# Patient Record
Sex: Male | Born: 1985 | Race: White | Hispanic: No | Marital: Married | Smoking: Never smoker
Health system: Southern US, Community
[De-identification: ages and names within clinical notes are randomized; demographics above are authoritative.]

## PROBLEM LIST (undated history)

## (undated) DIAGNOSIS — R202 Paresthesia of skin: Secondary | ICD-10-CM

## (undated) DIAGNOSIS — R001 Bradycardia, unspecified: Secondary | ICD-10-CM

## (undated) HISTORY — PX: BACK SURGERY: SHX140

## (undated) HISTORY — DX: Paresthesia of skin: R20.2

## (undated) HISTORY — PX: COLOPROCTECTOMY W/ ILEO J POUCH: SUR277

---
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
## 2004-10-05 ENCOUNTER — Ambulatory Visit: Payer: Self-pay | Admitting: Gastroenterology

## 2006-05-24 ENCOUNTER — Ambulatory Visit: Payer: Self-pay | Admitting: Gastroenterology

## 2008-01-02 ENCOUNTER — Other Ambulatory Visit: Admission: RE | Admit: 2008-01-02 | Discharge: 2008-01-02 | Payer: Self-pay | Admitting: Radiology

## 2008-11-17 ENCOUNTER — Ambulatory Visit: Payer: Self-pay | Admitting: Gastroenterology

## 2008-11-18 LAB — CONVERTED CEMR LAB
ALT: 15 units/L (ref 0–53)
AST: 18 units/L (ref 0–37)
Albumin: 4 g/dL (ref 3.5–5.2)
Alkaline Phosphatase: 89 units/L (ref 39–117)
BUN: 12 mg/dL (ref 6–23)
Basophils Absolute: 0 10*3/uL (ref 0.0–0.1)
Basophils Relative: 0.1 % (ref 0.0–3.0)
CO2: 33 meq/L — ABNORMAL HIGH (ref 19–32)
Calcium: 9.5 mg/dL (ref 8.4–10.5)
Chloride: 102 meq/L (ref 96–112)
Creatinine, Ser: 1.1 mg/dL (ref 0.4–1.5)
Eosinophils Absolute: 0.3 10*3/uL (ref 0.0–0.7)
Eosinophils Relative: 4 % (ref 0.0–5.0)
GFR calc Af Amer: 107 mL/min
GFR calc non Af Amer: 88 mL/min
Glucose, Bld: 79 mg/dL (ref 70–99)
HCT: 43.6 % (ref 39.0–52.0)
Hemoglobin: 15.1 g/dL (ref 13.0–17.0)
Lymphocytes Relative: 25.3 % (ref 12.0–46.0)
MCHC: 34.7 g/dL (ref 30.0–36.0)
MCV: 88.6 fL (ref 78.0–100.0)
Monocytes Absolute: 0.6 10*3/uL (ref 0.1–1.0)
Monocytes Relative: 7.9 % (ref 3.0–12.0)
Neutro Abs: 5.2 10*3/uL (ref 1.4–7.7)
Neutrophils Relative %: 62.7 % (ref 43.0–77.0)
Platelets: 361 10*3/uL (ref 150–400)
Potassium: 4 meq/L (ref 3.5–5.1)
RBC: 4.92 M/uL (ref 4.22–5.81)
RDW: 11.6 % (ref 11.5–14.6)
Sed Rate: 12 mm/hr (ref 0–16)
Sodium: 141 meq/L (ref 135–145)
Total Bilirubin: 0.7 mg/dL (ref 0.3–1.2)
Total Protein: 7.2 g/dL (ref 6.0–8.3)
WBC: 8.2 10*3/uL (ref 4.5–10.5)

## 2008-11-20 ENCOUNTER — Encounter: Payer: Self-pay | Admitting: Gastroenterology

## 2008-11-20 ENCOUNTER — Ambulatory Visit: Payer: Self-pay | Admitting: Gastroenterology

## 2008-11-24 ENCOUNTER — Encounter: Payer: Self-pay | Admitting: Gastroenterology

## 2008-12-15 ENCOUNTER — Ambulatory Visit: Payer: Self-pay | Admitting: Gastroenterology

## 2009-08-10 ENCOUNTER — Ambulatory Visit: Payer: Self-pay | Admitting: Internal Medicine

## 2009-08-10 ENCOUNTER — Telehealth: Payer: Self-pay | Admitting: Gastroenterology

## 2009-08-10 DIAGNOSIS — K625 Hemorrhage of anus and rectum: Secondary | ICD-10-CM | POA: Insufficient documentation

## 2009-08-11 ENCOUNTER — Encounter: Payer: Self-pay | Admitting: Physician Assistant

## 2009-08-11 LAB — CONVERTED CEMR LAB
BUN: 13 mg/dL (ref 6–23)
Basophils Absolute: 0.1 10*3/uL (ref 0.0–0.1)
Basophils Relative: 0.9 % (ref 0.0–3.0)
CO2: 33 meq/L — ABNORMAL HIGH (ref 19–32)
Calcium: 8.9 mg/dL (ref 8.4–10.5)
Chloride: 104 meq/L (ref 96–112)
Creatinine, Ser: 1.3 mg/dL (ref 0.4–1.5)
Eosinophils Absolute: 0.3 10*3/uL (ref 0.0–0.7)
Eosinophils Relative: 5.4 % — ABNORMAL HIGH (ref 0.0–5.0)
GFR calc non Af Amer: 72.07 mL/min (ref >60)
Glucose, Bld: 80 mg/dL (ref 70–99)
HCT: 41 % (ref 39.0–52.0)
Hemoglobin: 13.6 g/dL (ref 13.0–17.0)
Lymphocytes Relative: 24.1 % (ref 12.0–46.0)
Lymphs Abs: 1.4 10*3/uL (ref 0.7–4.0)
MCHC: 33.2 g/dL (ref 30.0–36.0)
MCV: 85.5 fL (ref 78.0–100.0)
Monocytes Absolute: 0.8 10*3/uL (ref 0.1–1.0)
Monocytes Relative: 13.4 % — ABNORMAL HIGH (ref 3.0–12.0)
Neutro Abs: 3.4 10*3/uL (ref 1.4–7.7)
Neutrophils Relative %: 56.2 % (ref 43.0–77.0)
Platelets: 361 10*3/uL (ref 150.0–400.0)
Potassium: 4 meq/L (ref 3.5–5.1)
RBC: 4.79 M/uL (ref 4.22–5.81)
RDW: 12 % (ref 11.5–14.6)
Sodium: 139 meq/L (ref 135–145)
WBC: 6 10*3/uL (ref 4.5–10.5)

## 2009-08-19 ENCOUNTER — Telehealth (INDEPENDENT_AMBULATORY_CARE_PROVIDER_SITE_OTHER): Payer: Self-pay | Admitting: *Deleted

## 2009-08-26 ENCOUNTER — Ambulatory Visit: Payer: Self-pay | Admitting: Gastroenterology

## 2009-10-13 ENCOUNTER — Encounter: Payer: Self-pay | Admitting: Gastroenterology

## 2009-10-13 ENCOUNTER — Telehealth: Payer: Self-pay | Admitting: Gastroenterology

## 2009-11-02 ENCOUNTER — Ambulatory Visit: Payer: Self-pay | Admitting: Gastroenterology

## 2009-11-02 DIAGNOSIS — K219 Gastro-esophageal reflux disease without esophagitis: Secondary | ICD-10-CM | POA: Insufficient documentation

## 2010-01-07 ENCOUNTER — Ambulatory Visit: Payer: Self-pay | Admitting: Gastroenterology

## 2010-01-07 LAB — CONVERTED CEMR LAB
Albumin: 4.2 g/dL (ref 3.5–5.2)
Alkaline Phosphatase: 69 units/L (ref 39–117)
Basophils Absolute: 0.1 10*3/uL (ref 0.0–0.1)
Eosinophils Absolute: 0.4 10*3/uL (ref 0.0–0.7)
Hemoglobin: 14.6 g/dL (ref 13.0–17.0)
Lymphocytes Relative: 22.6 % (ref 12.0–46.0)
MCHC: 32.7 g/dL (ref 30.0–36.0)
MCV: 85.3 fL (ref 78.0–100.0)
Monocytes Absolute: 0.7 10*3/uL (ref 0.1–1.0)
Neutro Abs: 4.2 10*3/uL (ref 1.4–7.7)
RDW: 14.2 % (ref 11.5–14.6)

## 2010-01-19 ENCOUNTER — Telehealth: Payer: Self-pay | Admitting: Gastroenterology

## 2010-01-20 ENCOUNTER — Ambulatory Visit: Payer: Self-pay | Admitting: Gastroenterology

## 2010-01-20 LAB — CONVERTED CEMR LAB
ALT: 12 units/L (ref 0–53)
AST: 13 units/L (ref 0–37)
Albumin: 3.8 g/dL (ref 3.5–5.2)
Alkaline Phosphatase: 56 units/L (ref 39–117)
Basophils Relative: 0.5 % (ref 0.0–3.0)
Eosinophils Relative: 4.9 % (ref 0.0–5.0)
HCT: 41.6 % (ref 39.0–52.0)
Hemoglobin: 13.9 g/dL (ref 13.0–17.0)
Lymphs Abs: 2.4 10*3/uL (ref 0.7–4.0)
Monocytes Relative: 10.2 % (ref 3.0–12.0)
Neutro Abs: 5 10*3/uL (ref 1.4–7.7)
RBC: 4.84 M/uL (ref 4.22–5.81)
RDW: 13 % (ref 11.5–14.6)
Total Protein: 6.4 g/dL (ref 6.0–8.3)
WBC: 8.7 10*3/uL (ref 4.5–10.5)

## 2010-01-25 ENCOUNTER — Telehealth: Payer: Self-pay | Admitting: Gastroenterology

## 2010-01-27 ENCOUNTER — Ambulatory Visit: Payer: Self-pay | Admitting: Gastroenterology

## 2010-01-27 ENCOUNTER — Inpatient Hospital Stay (HOSPITAL_COMMUNITY): Admission: AD | Admit: 2010-01-27 | Discharge: 2010-02-08 | Payer: Self-pay | Admitting: Gastroenterology

## 2010-02-02 ENCOUNTER — Encounter: Payer: Self-pay | Admitting: Gastroenterology

## 2010-02-08 ENCOUNTER — Telehealth: Payer: Self-pay | Admitting: Gastroenterology

## 2010-02-08 ENCOUNTER — Encounter (INDEPENDENT_AMBULATORY_CARE_PROVIDER_SITE_OTHER): Payer: Self-pay | Admitting: *Deleted

## 2010-02-24 ENCOUNTER — Ambulatory Visit: Payer: Self-pay | Admitting: Gastroenterology

## 2010-03-02 ENCOUNTER — Encounter: Payer: Self-pay | Admitting: Gastroenterology

## 2010-03-03 ENCOUNTER — Encounter: Payer: Self-pay | Admitting: Gastroenterology

## 2010-03-03 ENCOUNTER — Encounter (HOSPITAL_COMMUNITY): Admission: RE | Admit: 2010-03-03 | Discharge: 2010-06-01 | Payer: Self-pay | Admitting: Gastroenterology

## 2010-03-10 ENCOUNTER — Ambulatory Visit: Payer: Self-pay | Admitting: Gastroenterology

## 2010-03-10 DIAGNOSIS — K5150 Left sided colitis without complications: Secondary | ICD-10-CM

## 2010-03-10 DIAGNOSIS — A0472 Enterocolitis due to Clostridium difficile, not specified as recurrent: Secondary | ICD-10-CM | POA: Insufficient documentation

## 2010-03-17 ENCOUNTER — Encounter: Payer: Self-pay | Admitting: Gastroenterology

## 2010-04-14 ENCOUNTER — Encounter: Payer: Self-pay | Admitting: Gastroenterology

## 2010-04-16 ENCOUNTER — Telehealth: Payer: Self-pay | Admitting: Gastroenterology

## 2010-04-20 ENCOUNTER — Ambulatory Visit: Payer: Self-pay | Admitting: Gastroenterology

## 2010-04-27 ENCOUNTER — Ambulatory Visit: Payer: Self-pay | Admitting: Gastroenterology

## 2010-05-13 ENCOUNTER — Telehealth: Payer: Self-pay | Admitting: Gastroenterology

## 2010-05-14 ENCOUNTER — Ambulatory Visit: Payer: Self-pay | Admitting: Gastroenterology

## 2010-05-14 ENCOUNTER — Ambulatory Visit: Payer: Self-pay | Admitting: Nurse Practitioner

## 2010-05-14 LAB — CONVERTED CEMR LAB
AST: 19 units/L (ref 0–37)
BUN: 18 mg/dL (ref 6–23)
Basophils Absolute: 0 10*3/uL (ref 0.0–0.1)
CO2: 27 meq/L (ref 19–32)
Calcium: 9.3 mg/dL (ref 8.4–10.5)
Creatinine, Ser: 1 mg/dL (ref 0.4–1.5)
Eosinophils Relative: 0.1 % (ref 0.0–5.0)
GFR calc non Af Amer: 92.64 mL/min (ref >60)
Glucose, Bld: 166 mg/dL — ABNORMAL HIGH (ref 70–99)
Monocytes Relative: 2.3 % — ABNORMAL LOW (ref 3.0–12.0)
Neutrophils Relative %: 92.5 % — ABNORMAL HIGH (ref 43.0–77.0)
Platelets: 342 10*3/uL (ref 150.0–400.0)
Potassium: 4.5 meq/L (ref 3.5–5.1)
RDW: 16 % — ABNORMAL HIGH (ref 11.5–14.6)
Sodium: 139 meq/L (ref 135–145)
WBC: 7.1 10*3/uL (ref 4.5–10.5)

## 2010-06-08 ENCOUNTER — Encounter (HOSPITAL_COMMUNITY): Admission: RE | Admit: 2010-06-08 | Discharge: 2010-08-03 | Payer: Self-pay | Admitting: Gastroenterology

## 2010-06-11 ENCOUNTER — Encounter: Payer: Self-pay | Admitting: Gastroenterology

## 2010-06-24 ENCOUNTER — Encounter: Payer: Self-pay | Admitting: Gastroenterology

## 2010-07-01 ENCOUNTER — Encounter: Payer: Self-pay | Admitting: Gastroenterology

## 2010-07-16 ENCOUNTER — Encounter: Payer: Self-pay | Admitting: Gastroenterology

## 2010-08-12 ENCOUNTER — Encounter: Payer: Self-pay | Admitting: Gastroenterology

## 2010-12-07 NOTE — Letter (Signed)
Summary: [facility]  [facility]   Imported By: Lester Fullerton 04/21/2010 08:23:49  _____________________________________________________________________  External Attachment:    Type:   Image     Comment:   External Document

## 2010-12-07 NOTE — Letter (Signed)
Summary: [facility]   Imported By: Lester  07/20/2010 09:32:39  _____________________________________________________________________  External Attachment:    Type:   Image     Comment:   External Document

## 2010-12-07 NOTE — Progress Notes (Signed)
Summary: Triage   Phone Note Call from Patient Call back at Home Phone [phone]   Caller: Christian Joseph   Call Patient Call For: Dr. Arlyce Dice Reason for Call: Talk to Nurse Summary of Call: Needs hosp. f/u appt. within 14 days for colitis. Needs sooner than next avail. Initial call taken by: Karna Christmas,  February 08, 2010 10:58 AM  Follow-up for Phone Call        No appts available with Dr.Leronda Lewers until May. Per Christian Joseph PAC, pt. needs to be seen sooner, an extender is O.K.  Pt. will see Willette Cluster NP on 02-24-10 at 10am. Pt. instructed to call back as needed.  Follow-up by: Laureen Ochs LPN,  February 08, 2010 11:26 AM

## 2010-12-07 NOTE — Assessment & Plan Note (Signed)
Summary: F/U FROM FLEX/COLITIS AND PRED. 40MG      (DR.KAPLAN PT.)  DEB...    History of Present Illness Visit Type: Follow-up Visit Primary GI MD: Melvia Heaps MD [facility] Primary Provider: Evelena Peat, MD Requesting Provider: n/a Chief Complaint: ulcerative colitis flare, patient still having symptoms, Prednisone not helping . History of Present Illness:   Patient is a 25 year old male followed Dr. Arlyce Dice for ulcerative colitis and C-difficile. He is on multiple IBD meds and recently completed his third Remicade infusion. For persistent mucoid, bloody stools patient had flexible sigmoidoscopy on 04/20/10 which revealed moderately severe colitis beginning in rectal vault and extending to descending colon. Upon review of biopsies Dr. Arlyce Dice increased patient's Prednisone from 20mg  to 40mg  and asked that patient come in today for follow up. Still having several loose stools a day with some morning time rectal bleeding but overall he reports inprovement on higher steroid dose. Some rectal bleeding in the am. No abdominal pain. No fevers.    GI Review of Systems      Denies abdominal pain, acid reflux, belching, bloating, chest pain, dysphagia with liquids, dysphagia with solids, heartburn, loss of appetite, nausea, vomiting, vomiting blood, weight loss, and  weight gain.      Reports diarrhea and  rectal bleeding.     Denies anal fissure, black tarry stools, change in bowel habit, constipation, diverticulosis, fecal incontinence, heme positive stool, hemorrhoids, irritable bowel syndrome, jaundice, light color stool, liver problems, and  rectal pain.    Current Medications (verified): 1)  Robinul-Forte 2 Mg Tabs (Glycopyrrolate) .... Take 1 Tab Two Times A Day As Needed For Cramping and Spasms 2)  Prednisone 10 Mg Tabs (Prednisone) .... Take Three Tablets By Mouth Once Daily 3)  Azathioprine 50 Mg Tabs (Azathioprine) .... Take 2.5 Tabs By Mouth Once Daily 4)  Cortenema 100 Mg/11ml Enem  (Hydrocortisone) .... Take One Enema At Bedtime 5)  Lialda 1.2 Gm Tbec (Mesalamine) .... Take Two By Mouth Two Times A Day 6)  Remicade 100 Mg Solr (Infliximab) .... As Directed  Allergies (verified): No Known Drug Allergies  Past History:  Past Medical History: Reviewed history from 11/13/2008 and no changes required. 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 Spinal fusion surgery  Family History: Reviewed history from 02/24/2010 and no changes required. No FH of Colon Cancer:  Social History: Reviewed history from 11/17/2008 and no changes required. 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  Alcohol Use - yes - occasionally Daily Caffeine Use Illicit Drug Use - no Patient gets regular exercise.  Review of Systems  The patient denies allergy/sinus, anemia, anxiety-new, arthritis/joint pain, back pain, blood in urine, breast changes/lumps, change in vision, confusion, cough, coughing up blood, depression-new, fainting, fatigue, fever, headaches-new, hearing problems, heart murmur, heart rhythm changes, itching, menstrual pain, muscle pains/cramps, night sweats, nosebleeds, pregnancy symptoms, shortness of breath, skin rash, sleeping problems, sore throat, swelling of feet/legs, swollen lymph glands, thirst - excessive , urination - excessive , urination changes/pain, urine leakage, vision changes, and voice change.    Vital Signs:  Patient profile:   25 year old male Height:      73 inches Weight:      193.50 pounds BMI:     25.62 Pulse rate:   76 / minute Pulse rhythm:   regular BP sitting:   104 / 56  (left arm) Cuff size:   regular  Vitals Entered By: June McMurray CMA Duncan Dull) (April 27, 2010 1:19 PM)  Physical Exam  General:  Well developed, well nourished, no acute distress. Head:  Normocephalic and atraumatic. Eyes:   Conjunctiva pink, no icterus.  Neck:  no obvious masses  Lungs:  Clear throughout to auscultation. Heart:  Regular rate and rhythm; no murmurs, rubs,  or bruits. Abdomen:  Soft, nontender and nondistended. No masses, hepatosplenomegaly or hernias noted. Normal bowel sounds. Neurologic:  Alert and  oriented x4;  grossly normal neurologically. Psych:  Alert and cooperative. Normal mood and affect.   Impression & Recommendations:  Problem # 1:  ULCERATIVE COLITIS, LEFT SIDED (ICD-556.5) Assessment Deteriorated Somewhat improved since increasing Prednisone to 40mg  on 04/23/10. Next Remicade (4th dose) due in August. For now, continue Prednisone 40mg  daily, Lialda 4.8 gms daily, and Cortenema daily. Will increase Azathioprine from 150mg  to 200mg  daily. Check CBC in one week. Follow up with Dr. Arlyce Dice in 2-3 weeks.  Patient will be called with CBC results and if doing better we may start tapering Prednisone.   Patient Instructions: 1)  Increase Azathioprine to 200 MG daily. ( Take 4 - 50 mg daily) . 2)  Come to our lab in 1 week on 05-04-10.  3)  We made you an appointment with Dr. Arlyce Dice for 05-14-10. Appt card given. 4)  The medication list was reviewed and reconciled.  All changed / newly prescribed medications were explained.  A complete medication list was provided to the patient / caregiver. Prescriptions: AZATHIOPRINE 50 MG TABS (AZATHIOPRINE) take 4  tabs by mouth once daily  #120 x 1   Entered by:   Lowry Ram NCMA   Authorized by:   Willette Cluster NP   Signed by:   Lowry Ram NCMA on 04/27/2010   Method used:   [facility]        [facility]  [place] [address]retail[facility]       [address]       [place], [place]  [address]       Ph: [phone] or [phone]       Fax: [phone]   RxID:   [id number]

## 2010-12-07 NOTE — Op Note (Signed)
Summary: Remicade/[place]  Remicade/[place]   Imported By: Sherian Rein 06/21/2010 12:33:22  _____________________________________________________________________  External Attachment:    Type:   Image     Comment:   External Document

## 2010-12-07 NOTE — Initial Assessments (Signed)
Summary: follow up from 3/16 visit-rs 9:00appt    History of Present Illness Visit Type: Hospital Admission Primary GI MD: Melvia Heaps MD [facility] Primary Provider: Evelena Peat, MD Requesting Provider: n/a Chief Complaint: Follow up from Prednisone increase and abdominal pain. Pt needs refill on Azathioprine he is out, is currently taking twice daily. Pt still c/o minimul abdominal pain and rectal bleeding History of Present Illness:   [name] is a 25 year old white male with left-sided ulcerative colitis diagnosed in 2004.  In the past he has had biopsies more suggestive of Crohn's disease than ulcerative colitis, however, IBD serologies were suggestive of ulcerative colitis with a p-ANCA  of 92.7 with a perinuclear pattern.  He has been maintained with lialda.  A clinical flareup in December, 2010 was treated with steroids.  Over the past month he has had a flare characterized by frequent loose and bloody stools accompanied  by urgency.  Prednisone last week was increased from 20 to 40 mg a day.  Rowasa enemas and CortFoam enemas were added and his Imuran was increased to 100 mg daily.  Despite these measures symptoms have not improved.  He is having at least 8-10 bowel movements a day.  Stools are loose and mixed with blood.  He is without fever.  He has been on no recent antibiotics.   GI Review of Systems    Reports abdominal pain.      Denies acid reflux, belching, bloating, chest pain, dysphagia with liquids, dysphagia with solids, heartburn, loss of appetite, nausea, vomiting, vomiting blood, weight loss, and  weight gain.      Reports rectal bleeding.     Denies anal fissure, black tarry stools, change in bowel habit, constipation, diarrhea, diverticulosis, fecal incontinence, heme positive stool, hemorrhoids, irritable bowel syndrome, jaundice, light color stool, liver problems, and  rectal pain.    Current Medications (verified): 1)  Robinul-Forte 2 Mg Tabs (Glycopyrrolate) ....  Take 1 Tab Two Times A Day As Needed For Cramping and Spasms 2)  Prednisone 20 Mg Tabs (Prednisone) .... 2 By Mouth Once Daily 3)  Azathioprine 50 Mg Tabs (Azathioprine) .Marland Kitchen.. 1 By Mouth Two Times A Day 4)  Cortenema 100 Mg/7ml Enem (Hydrocortisone) .... Take One Enema Q.h.s. 5)  Cortifoam 90 Mg Foam (Hydrocortisone Acetate) .... Take One Q.a.m.  Allergies (verified): No Known Drug Allergies  Past History:  Past Medical History: Last updated: 11/13/2008 Current Problems:  COLONIC POLYPS (ICD-211.3) Hx of LEFT SIDED ULCERATIVE COLITIS (ICD-556.5)  Past Surgical History: Last updated: 11/13/2008 Spinal fusion surgery  Family History: Reviewed history from 08/26/2009 and no changes required. unremarkable No FH of Colon Cancer:  Social History: Reviewed history from 11/17/2008 and no changes required. 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  Alcohol Use - yes - occasionally Daily Caffeine Use Illicit Drug Use - no Patient gets regular exercise.  Review of Systems  The patient denies allergy/sinus, anemia, anxiety-new, arthritis/joint pain, back pain, blood in urine, breast changes/lumps, change in vision, confusion, cough, coughing up blood, depression-new, fainting, fatigue, fever, headaches-new, hearing problems, heart murmur, heart rhythm changes, itching, muscle pains/cramps, night sweats, nosebleeds, shortness of breath, skin rash, sleeping problems, sore throat, swelling of feet/legs, swollen lymph glands, thirst - excessive, urination - excessive, urination changes/pain, urine leakage, vision changes, and voice change.    Vital Signs:  Patient profile:   25 year old male Height:      73 inches Weight:      191 pounds BMI:  25.29 BSA:     2.11 Pulse rate:   68 / minute Pulse rhythm:   regular BP sitting:   112 / 74  (left arm)  Vitals Entered By: Merri Ray CMA Duncan Dull) (January 27, 2010 9:18 AM)  Physical Exam  Additional Exam:  On physical exam  he is a well-developed well-nourished male  Physical Exam: General:   WDWN HEENT:   anicteric.  No pharyngeal abnormalities Neck:   No masses, thyroidmegaly Nodes:   No cervical, axillary, inguinal adenopathy Chest:    Clear to auscultation Cardiac:   No murmurs, gallops, rubs Abdomen:   BS active.  No abd masses, tenderness, organomegaly Rectal:   Deferred Extremities:   No cyanosis, clubbing, edema Skeletal:   No deformities Neuro:   Alert, oriented x3.  No focal abnormalities     Impression & Recommendations:  Problem # 1:  Hx of LEFT SIDED ULCERATIVE COLITIS (ICD-556.5) Patient continues in a flare despite medical therapy.  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  Recommendations #1 admit for bowel rest, IV steroids and continuation of lialda and Imuran #2 check stools for C. difficile toxin #3 empiric therapy with Flagyl 500 mg t.i.d. pending results of stool studies #4 flexible sigmoidoscopy in the next 48 hours if he is not clinically improved

## 2010-12-07 NOTE — Assessment & Plan Note (Signed)
Summary: F/U APPT...LSW.    History of Present Illness Visit Type: Follow-up Visit Primary GI MD: Christian Heaps MD [facility] Primary Provider: Evelena Peat, MD Requesting Provider: n/a Chief Complaint: lower abd pain and diarrhea  History of Present Illness:   Christian Joseph has returned for followup of his left-sided ulcerative colitis.  Diarrhea continues despite increasing prednisone.  He is  currently taking 10 mg a day.  He was started at 20 mg.  He is on lialda and recently started Imuran.  His TP MT enzyme activity tested normal.  He is having multiple bowel movements a day with urgency.   GI Review of Systems    Reports abdominal pain.     Location of  Abdominal pain: lower abdomen.    Denies acid reflux, belching, bloating, chest pain, dysphagia with liquids, dysphagia with solids, heartburn, loss of appetite, nausea, vomiting, vomiting blood, weight loss, and  weight gain.      Reports diarrhea.     Denies anal fissure, black tarry stools, change in bowel habit, constipation, diverticulosis, fecal incontinence, heme positive stool, hemorrhoids, irritable bowel syndrome, jaundice, light color stool, liver problems, rectal bleeding, and  rectal pain.    Current Medications (verified): 1)  Robinul-Forte 2 Mg Tabs (Glycopyrrolate) .... Take 1 Tab Two Times A Day As Needed For Cramping and Spasms 2)  Lialda 1.2 Gm Tbec (Mesalamine) .... Four Tablets By Mouth Daily 3)  Prednisone 10 Mg Tabs (Prednisone) .... Use As Directed 4)  Azathioprine 50 Mg Tabs (Azathioprine) .... Take One Tab Daily  Allergies (verified): No Known Drug Allergies  Past History:  Past Medical History: Reviewed history from 11/13/2008 and no changes required. 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 Spinal fusion surgery  Family History: Reviewed history from 08/26/2009 and no  changes required. unremarkable No FH of Colon Cancer:  Social History: Reviewed history from 11/17/2008 and no changes required. 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  Alcohol Use - yes - occasionally Daily Caffeine Use Illicit Drug Use - no Patient gets regular exercise.  Review of Systems  The patient denies allergy/sinus, anemia, anxiety-new, arthritis/joint pain, back pain, blood in urine, breast changes/lumps, change in vision, confusion, cough, coughing up blood, depression-new, fainting, fatigue, fever, headaches-new, hearing problems, heart murmur, heart rhythm changes, itching, muscle pains/cramps, night sweats, nosebleeds, shortness of breath, skin rash, sleeping problems, sore throat, swelling of feet/legs, swollen lymph glands, thirst - excessive, urination - excessive, urination changes/pain, urine leakage, vision changes, and voice change.    Vital Signs:  Patient profile:   25 year old male Height:      73 inches Weight:      195 pounds BMI:     25.82 BSA:     2.13 Pulse rate:   76 / minute Pulse rhythm:   regular BP sitting:   100 / 64  (left arm) Cuff size:   regular  Vitals Entered By: Christian Joseph CMA (January 20, 2010 8:44 AM)   Impression & Recommendations:  Problem # 1:  Hx of LEFT SIDED ULCERATIVE COLITIS (ICD-556.5) As far she has not responded to his medical regimen.  Condition is #1 increase prednisone to 40 mg a day #2 add cord enemas q.h.s. and cortFoam q.a.m. #3 increase Imuran to 100 mg a day #4 patient will return in 5 days.  If he is not significantly improved I will hospitalize him and placement bowel  rest  Patient Instructions: 1)  We are sending in a med to your pharmacy 2)  We have scheduled you a follow up appointment for 01/27/2010 at 9am 3)  The medication list was reviewed and reconciled.  All changed / newly prescribed medications were explained.  A complete medication list was provided to the patient /  caregiver. Prescriptions: CORTIFOAM 90 MG FOAM (HYDROCORTISONE ACETATE) take one q.a.m.  #15 x 2   Entered and Authorized by:   Christian Meckel MD   Signed by:   Christian Meckel MD on 01/20/2010   Method used:   [facility]  [place] [address]       [address]       [place], [place]  [address]       Ph: [phone] or [phone]       Fax: [phone]   RxID:   [id number] CORTENEMA 100 MG/60ML ENEM (HYDROCORTISONE) take one enema q.h.s.  #15 x 2   Entered and Authorized by:   Christian Meckel MD   Signed by:   Christian Meckel MD on 01/20/2010   Method used:   [facility]        [facility]  [place] [address]       [address]       [place], [place]  [address]       Ph: [phone] or [phone]       Fax: [phone]   RxID:   [id number]

## 2010-12-07 NOTE — Assessment & Plan Note (Signed)
Summary: POST HOSPITAL F/U, COLITIS          Christian Joseph   History of Present Illness Visit Type: Follow-up Visit Primary GI MD: Melvia Heaps MD [facility] Primary Provider: Evelena Peat, MD Requesting Provider: n/a Chief Complaint: pt is still having diarrhea, bowel movements with blood and mucus.  Pt denies any abdominal pain History of Present Illness:   is to [place] has returned following hospitalization for his ulcerative colitis.  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  He remains on prednisone 30 mg daily in addition to azathioprine 125 mg a day, lialda 4.8 g daily or enemas and Cortifoam.  On this regimen he may have 3-4 loose stools preceded by passage of mucus and blood on any given today.  He may go another day without any bowel movements. A PPD  placed while he was in the hospital and was negative.   GI Review of Systems      Denies abdominal pain, acid reflux, belching, bloating, chest pain, dysphagia with liquids, dysphagia with solids, heartburn, loss of appetite, nausea, vomiting, vomiting blood, weight loss, and  weight gain.      Reports constipation, diarrhea, and  rectal bleeding.     Denies anal fissure, black tarry stools, change in bowel habit, diverticulosis, fecal incontinence, heme positive stool, hemorrhoids, irritable bowel syndrome, jaundice, light color stool, liver problems, and  rectal pain.    Current Medications (verified): 1)  Robinul-Forte 2 Mg Tabs (Glycopyrrolate) .... Take 1 Tab Two Times A Day As Needed For Cramping and Spasms 2)  Prednisone 10 Mg Tabs (Prednisone) .... Take Three Tablets By Mouth Once Daily 3)  Azathioprine 50 Mg Tabs (Azathioprine) .... Take 3.5 Tabs By Mouth Once Daily 4)  Cortenema 100 Mg/1ml Enem (Hydrocortisone) .... Take One Enema At Bedtime 5)  Cortifoam 90 Mg Foam (Hydrocortisone Acetate) .... Take One Every Morning 6)  Lialda 1.2 Gm Tbec (Mesalamine) .... Take Two By Mouth Two Times A  Day  Allergies (verified): No Known Drug Allergies  Past History:  Past Medical History: Reviewed history from 11/13/2008 and no changes required. 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 Spinal fusion surgery  Family History: Reviewed history from 08/26/2009 and no changes required. No FH of Colon Cancer:  Social History: Reviewed history from 11/17/2008 and no changes required. 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  Alcohol Use - yes - occasionally Daily Caffeine Use Illicit Drug Use - no Patient gets regular exercise.  Review of Systems  The patient denies allergy/sinus, anemia, anxiety-new, arthritis/joint pain, back pain, blood in urine, breast changes/lumps, confusion, cough, coughing up blood, depression-new, fainting, fatigue, fever, headaches-new, hearing problems, heart murmur, heart rhythm changes, itching, muscle pains/cramps, night sweats, nosebleeds, shortness of breath, skin rash, sleeping problems, sore throat, swelling of feet/legs, swollen lymph glands, thirst - excessive, urination - excessive, urination changes/pain, urine leakage, vision changes, and voice change.    Vital Signs:  Patient profile:   25 year old male Height:      73 inches Weight:      185 pounds BMI:     24.50 Pulse rate:   80 / minute Pulse rhythm:   regular BP sitting:   110 / 56  (left arm) Cuff size:   regular  Vitals Entered By: Francee Piccolo CMA Duncan Dull) (February 24, 2010 2:06 PM)   Impression & Recommendations:  Problem # 1:  Hx of ULCERATIVE COLITIS, LEFT SIDED (ICD-556.5) he has had a partial response to intensive medical therapy.  I do not think that pseudomembranous  colitis is significantly contributing to her symptoms, certainly at this point.  Medications #1 continue current medications #2 begin Remicade #3 repeat  stools for C. difficile toxin  Patient Instructions: 1)  Copy sent to : Bruce Burchette,MD 2)  You will begin Remicaid treatments 3)  You follow up appointment with Dr Arlyce Dice is 03/10/2010 at 10:15am 4)  The medication list was reviewed and reconciled.  All changed / newly prescribed medications were explained.  A complete medication list was provided to the patient / caregiver.  Appended Document: Orders Update    Clinical Lists Changes  Orders: Added new Test order of T-Culture, C-Diff Toxin A/B [id number]) - Signed

## 2010-12-07 NOTE — Assessment & Plan Note (Signed)
Summary: ROV REMICADE F/U    History of Present Illness Visit Type: Follow-up Visit Primary GI MD: Melvia Heaps MD [facility] Primary Provider: Evelena Peat, MD Requesting Provider: n/a Chief Complaint: F/u for ulcerative colitis and starting Remicade. Pt states that he is feeling better c/o fatigue but no GI complaints  History of Present Illness:   Mr. Frazzini has  returned for followup of his ulcerative colitis. He received one Remicade infusion and remains on prednisone 30 mg daily,  Imuran, and lialda.  He reports improvement of his symptoms.  Bleeding is less and stools are becoming formed.  He complains of fatigue.  Stools for C. difficile toxin were negative.   GI Review of Systems      Denies abdominal pain, acid reflux, belching, bloating, chest pain, dysphagia with liquids, dysphagia with solids, heartburn, loss of appetite, nausea, vomiting, vomiting blood, weight loss, and  weight gain.        Denies anal fissure, black tarry stools, change in bowel habit, constipation, diarrhea, diverticulosis, fecal incontinence, heme positive stool, hemorrhoids, irritable bowel syndrome, jaundice, light color stool, liver problems, rectal bleeding, and  rectal pain.    Current Medications (verified): 1)  Robinul-Forte 2 Mg Tabs (Glycopyrrolate) .... Take 1 Tab Two Times A Day As Needed For Cramping and Spasms 2)  Prednisone 10 Mg Tabs (Prednisone) .... Take Three Tablets By Mouth Once Daily 3)  Azathioprine 50 Mg Tabs (Azathioprine) .... Take 2.5 Tabs By Mouth Once Daily 4)  Cortenema 100 Mg/29ml Enem (Hydrocortisone) .... Take One Enema At Bedtime 5)  Cortifoam 90 Mg Foam (Hydrocortisone Acetate) .... Take One Every Morning 6)  Lialda 1.2 Gm Tbec (Mesalamine) .... Take Two By Mouth Two Times A Day 7)  Remicade 100 Mg Solr (Infliximab) .... As Directed  Allergies (verified): No Known Drug Allergies  Past History:  Past Medical History: Reviewed history from 11/13/2008 and no changes  required. 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 Spinal fusion surgery  Family History: Reviewed history from 02/24/2010 and no changes required. No FH of Colon Cancer:  Social History: Reviewed history from 11/17/2008 and no changes required. 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  Alcohol Use - yes - occasionally Daily Caffeine Use Illicit Drug Use - no Patient gets regular exercise.  Review of Systems       The patient complains of fatigue.  The patient denies allergy/sinus, anemia, anxiety-new, arthritis/joint pain, back pain, blood in urine, breast changes/lumps, change in vision, confusion, cough, coughing up blood, depression-new, fainting, fever, headaches-new, hearing problems, heart murmur, heart rhythm changes, itching, muscle pains/cramps, night sweats, nosebleeds, shortness of breath, skin rash, sleeping problems, sore throat, swelling of feet/legs, swollen lymph glands, thirst - excessive, urination - excessive, urination changes/pain, urine leakage, vision changes, and voice change.    Vital Signs:  Patient profile:   25 year old male Height:      73 inches Weight:      184 pounds BMI:     24.36 BSA:     2.08 Pulse rate:   88 / minute Pulse rhythm:   regular BP sitting:   120 / 76  (left arm) Cuff size:   regular  Vitals Entered By: Ok Anis CMA (Mar 10, 2010 10:46 AM)   Impression & Recommendations:  Problem # 1:  ULCERATIVE COLITIS, LEFT SIDED (ICD-556.5) Assessment Improved He appears to be responding to  Remicade.  Recommendations #1 lower prednisone to 20 mg.  The patient was carefully instructed to call back in 5 days #2 continue Imuran and lialda N. 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  All changed / newly prescribed medications were explained.  A complete medication list was provided to the patient / caregiver. Prescriptions: AZATHIOPRINE 50 MG TABS (AZATHIOPRINE) take 2.5 tabs by mouth once daily  #75 Tablet x 2   Entered and Authorized by:   Louis Meckel MD   Signed by:   Louis Meckel MD on 03/10/2010   Method used:   [facility]  [place] [address]       [address]       [place], [place]  [address]       Ph: [phone] or [phone]       Fax: [phone]   RxID:   [id number]

## 2010-12-07 NOTE — Letter (Signed)
Summary: Appt Reminder 2  [facility]  [address] [place], [place] [address]   Phone: [phone]  Fax: [phone]        February 08, 2010 MRN: [id number]    GAURAV BALDREE [address] [facility] [address] [place], [place]  [address]    Dear Mr. PRESLEY,   You have a return appointment with Willette Cluster NP on 02-24-10 at 10am. Please remember to bring a complete list of the medicines you are taking, your insurance card and your co-pay.  If you have to cancel or reschedule this appointment, please call before 5:00 pm the evening before to avoid a cancellation fee.  If you have any questions or concerns, please call [phone].    Sincerely,    Laureen Ochs LPN  Appended Document: Appt Reminder 2 Letter mailed to patient.

## 2010-12-07 NOTE — Miscellaneous (Signed)
  Clinical Lists Changes  Orders: Added new Test order of TLB-CBC Platelet - w/Differential (85025-CBCD) - Signed Added new Test order of TLB-Hepatic/Liver Function Pnl (80076-HEPATIC) - Signed Added new Test order of TPMT Enzyme ([facility] [phone]) - Signed  Appended Document:  Prednisone taper: 20mg  everyday for 1 week 15mg  every day for 1 week 10mg  every day for 1 week 10mg  alternating with 5 for 1 week 5 mg for 1 week  Then Discontinue

## 2010-12-07 NOTE — Letter (Signed)
Summary: [facility]   Imported By: Sherian Rein 08/25/2010 10:10:17  _____________________________________________________________________  External Attachment:    Type:   Image     Comment:   External Document

## 2010-12-07 NOTE — Procedures (Signed)
Summary: Flexible Sigmoidoscopy  Patient: Christian Joseph Note: All result statuses are Final unless otherwise noted.  Tests: (1) Flexible Sigmoidoscopy (FLX)  FLX Flexible Sigmoidoscopy                             DONE     [facility]     [address]     [place], [place]  [address]           FLEXIBLE SIGMOIDOSCOPY PROCEDURE REPORT           PATIENT:  Christian Joseph, Christian Joseph  MR#:  [id number]     BIRTHDATE:  Aug 11, 1986, 23 yrs. old  GENDER:  male           ENDOSCOPIST:  Barbette Hair. Arlyce Dice, MD     Referred by:           PROCEDURE DATE:  04/20/2010     PROCEDURE:  Flexible Sigmoidoscopy with biopsy     ASA CLASS:  Class II     INDICATIONS:  rectal bleeding h/o left sided UC           MEDICATIONS:   Fentanyl 50 mcg IV, Versed 6 mg IV           DESCRIPTION OF PROCEDURE:   After the risks benefits and     alternatives of the procedure were thoroughly explained, informed     consent was obtained.  Digital rectal exam was performed and     revealed no abnormalities.   The [id number] endoscope     was introduced through the anus and advanced to the descending     colon, without limitations.  The quality of the prep was .  The     instrument was then slowly withdrawn as the mucosa was fully     examined.     <<PROCEDUREIMAGES>>           Colitis was found. Moderately severe colitis beginning in rectal     vault and extending to descending colon. Inflammatory changes     lessen more proximally. Marked edema and erythema but no     ulcerations or pseudomembranes. Bxs taken (see image1, image2,     image3, and image4).   Retroflexed views in the rectum revealed     not performed.    The scope was then withdrawn from the patient     and the procedure terminated.           COMPLICATIONS:  None           ENDOSCOPIC IMPRESSION:     1) Left sided Colitis           RECOMMENDATIONS:     1) await biopsy results     2) continue current meds; if bxs are negative for [facility] will  increase prednisone     3) Call the office to schedule a followup office visit for 2     weeks           REPEAT EXAM:  No           ______________________________     Barbette Hair. Arlyce Dice, MD           CC:  Evelena Peat, MD           n.     Rosalie DoctorBarbette Hair. Kaplan at 04/20/2010 03:47 PM           Vic Blackbird, [id number]  Note: An exclamation mark (!) indicates a result that was not dispersed into the flowsheet. Document Creation Date: 04/20/2010 3:48 PM _______________________________________________________________________  (1) Order result status: Final Collection or observation date-time: 04/20/2010 15:41 Requested date-time:  Receipt date-time:  Reported date-time:  Referring Physician:   Ordering Physician: Melvia Heaps [id number]) Specimen Source:  Source: Launa Grill Order Number: [id number] Lab site:

## 2010-12-07 NOTE — Op Note (Signed)
Summary: Remicade/[facility]  Remicade/[facility]   Imported By: Sherian Rein 03/08/2010 11:21:21  _____________________________________________________________________  External Attachment:    Type:   Image     Comment:   External Document

## 2010-12-07 NOTE — Progress Notes (Signed)
Summary: reminder call for labs   Phone Note Outgoing Call Call back at [facility] Phone [phone]   Call placed by: Merri Ray CMA Duncan Dull),  January 19, 2010 8:39 AM Summary of Call: Called pt to remind him to have his follow up labs drawn before is appointment on the 16th. orders in IDX Initial call taken by: Merri Ray CMA Duncan Dull),  January 19, 2010 8:39 AM

## 2010-12-07 NOTE — Letter (Signed)
Summary: [facility]   Imported By: Sherian Rein 08/25/2010 10:12:27  _____________________________________________________________________  External Attachment:    Type:   Image     Comment:   External Document

## 2010-12-07 NOTE — Letter (Signed)
Summary: [facility]  [facility]   Imported By: Lester Burkburnett 03/26/2010 11:03:48  _____________________________________________________________________  External Attachment:    Type:   Image     Comment:   External Document

## 2010-12-07 NOTE — Progress Notes (Signed)
Summary: TRIAGE-Blood in Stool/Sigmoidoscopy Scheduled   Phone Note Call from Patient Call back at Work Phone [phone]   Caller: Patient Call For: Dr. Arlyce Dice Reason for Call: Talk to Nurse Summary of Call: would like to discuss blood in stool... blood has gotten darker per pt Initial call taken by: Vallarie Mare,  April 16, 2010 8:30 AM  Follow-up for Phone Call        Last OV 03-10-10, had 3rd Remicade infusion  04-14-10. Takes Lialda 2 BID, Prednisone 20mg  daily, Azathioprine 125mg  daily, Cort enema QHS.  Pt. c/o blood in stools every morning, but this morning he had a black stool, the next BM was more normal in color with darker blood, the 3rd BM today was normal in color and no blood, all BM's are loose w/some urgency.   [facility] PLEASE ADVISE  Follow-up by: Laureen Ochs LPN,  April 16, 2010 12:40 PM  Additional Follow-up for Phone Call Additional follow up Details #1::        needs f. sig Additional Follow-up by: Louis Meckel MD,  April 16, 2010 1:48 PM    Additional Follow-up for Phone Call Additional follow up Details #2::    Pt. is scheduled for a flex.Sig. in [facility] on 04-20-10 at 3pm. Pt. instructed to be on clear liquids after 12mn the night before, NPO after 12noon 04-20-10, use 2 fleets emenas 1 hour before arrival, must have a driver. Pt. instructed to call back as needed.  Follow-up by: Laureen Ochs LPN,  April 16, 2010 2:25 PM

## 2010-12-07 NOTE — Procedures (Signed)
Summary: Colonoscopy  Patient: [name] Note: All result statuses are Final unless otherwise noted.  Tests: (1) Colonoscopy (COL)   COL Colonoscopy           DONE     [facility]     [address]     [place], [place]  [address]           COLONOSCOPY PROCEDURE REPORT           PATIENT:  Christian Joseph, Christian Joseph  MR#:  [id number]     BIRTHDATE:  05/30/1986, 23 yrs. old  GENDER:  male     ENDOSCOPIST:  Barbette Hair. Arlyce Dice, MD     REF. BY:     PROCEDURE DATE:  02/02/2010     PROCEDURE:  Colonoscopy with biopsy     ASA CLASS:  Class II     INDICATIONS:  unexplained diarrhea     MEDICATIONS:   Fentanyl 100 mcg IV, Versed 10 mg IV, Benadryl 25     mg IV           DESCRIPTION OF PROCEDURE:   After the risks benefits and     alternatives of the procedure were thoroughly explained, informed     consent was obtained.  Digital rectal exam was performed and     revealed no abnormalities.   The [id number] ([id number]) endoscope was     introduced through the anus and advanced to the cecum, which was     identified by the ileocecal valve, without limitations.  The     quality of the prep was Miralax fair.  The instrument was then     slowly withdrawn as the colon was fully examined.     <<PROCEDUREIMAGES>>           FINDINGS:  There were pseudomembranes present. Few pseudomembranes     in ascending and transverse colon. Underlying mucosa normal. Bxs     taken (see image004 and image006).  There were mucosal changes     consistent with left-sided ulcerative colitis. in the left colon.     Active colitis with diffuse mucosal erythema and edema. No     ulcerations. Bxs taken. Inflammatory changes extend to proximal     descending colon (see image008, image009, image010, and image011).     This was otherwise a normal examination of the colon (see image001     and image002).   Retroflexed views in the rectum revealed no     abnormalities.    The scope was then withdrawn from the  patient     and the procedure completed.           COMPLICATIONS:  None     ENDOSCOPIC IMPRESSION:     1) Pseudomembranes in right colon     2) Colitis - left UC in the left colon     3) Otherwise normal examination     RECOMMENDATIONS:Increase solumedrol to 40mg  qd     Continue flagyl and     vancomycin           REPEAT EXAM:  No           ______________________________     Barbette Hair. Arlyce Dice, MD           CC:  Evelena Peat, MD           n.     Rosalie DoctorBarbette Hair. Alexzavier Girardin at 02/02/2010 11:43 AM  [place], [place], [phone]  Note: An exclamation mark (!) indicates a result that was not dispersed into the flowsheet. Document Creation Date: 02/02/2010 11:44 AM _______________________________________________________________________  (1) Order result status: Final Collection or observation date-time: 02/02/2010 11:35 Requested date-time:  Receipt date-time:  Reported date-time:  Referring Physician:   Ordering Physician: Melvia Heaps [id number]) Specimen Source:  Source: Launa Grill Order Number: [id number] Lab site:

## 2010-12-07 NOTE — Letter (Signed)
Summary: Patient Discharge Instructions / [facility]   Patient Discharge Instructions / [facility]   Imported By: Lennie Odor 08/03/2010 14:57:22  _____________________________________________________________________  External Attachment:    Type:   Image     Comment:   External Document

## 2010-12-07 NOTE — Progress Notes (Signed)
Summary: Reminder labs   Phone Note Outgoing Call Call back at [facility] Phone [phone]   Call placed by: Merri Ray CMA Duncan Dull),  May 13, 2010 4:56 PM Summary of Call: Reminded pt to come in for labs before his office appoinment in the morning. Left message for pt. Initial call taken by: Merri Ray CMA [facility]),  May 13, 2010 4:57 PM

## 2010-12-07 NOTE — Miscellaneous (Signed)
Summary: Remicade Orders  Clinical Lists Changes  Orders: Added new Test order of Remicade Infusion (Remicade) - Signed 

## 2010-12-07 NOTE — Miscellaneous (Signed)
  Clinical Lists Changes  Medications: Added new medication of PREDNISONE 10 MG TABS (PREDNISONE) use as directed - Signed Added new medication of AZATHIOPRINE 50 MG TABS (AZATHIOPRINE) take one tab daily - Signed Rx of PREDNISONE 10 MG TABS (PREDNISONE) use as directed;  #50 x 1;  Signed;  Entered by: Louis Meckel MD;  Authorized by: Louis Meckel MD;  [facility]  [place] [address], [place], [place]  [address], Ph: [phone] or [phone], Fax: [phone] Rx of AZATHIOPRINE 50 MG TABS (AZATHIOPRINE) take one tab daily;  #30 x 5;  Signed;  Entered by: Louis Meckel MD;  Authorized by: Louis Meckel MD;  Method used: [facility]  [place] [address], [place], [place]  [address], Ph: [phone] or [phone], Fax: [phone]    Prescriptions: AZATHIOPRINE 50 MG TABS (AZATHIOPRINE) take one tab daily  #30 x 5   Entered and Authorized by:   Louis Meckel MD   Signed by:   Louis Meckel MD on 01/07/2010   Method used:   [facility]  [place] [address]       [address]       [place], [place]  [address]       Ph: [phone] or [phone]       Fax: [phone]   RxID:   [id number] PREDNISONE 10 MG TABS (PREDNISONE) use as directed  #50 x 1   Entered and Authorized by:   Louis Meckel MD   Signed by:   Louis Meckel MD on 01/07/2010   Method used:   [facility]        [facility]  [place] [address]ail[facility]       [address]       [place], [place]  [address]       Ph: [phone] or [phone]       Fax: [phone]   RxID:   [id number]

## 2010-12-07 NOTE — Progress Notes (Signed)
Summary: Prednisone refill   Phone Note Call from Patient Call back at Work Phone [phone]   Call For: Dr Arlyce Dice Summary of Call: Prednisone was increased and is all out now. Pharmacy will not refill because they say it's too soon. [facility] [place] [facility] [address] Initial call taken by: Leanor Kail [facility],  January 25, 2010 8:09 AM  Follow-up for Phone Call        [facility], [facility] will [facility] a new prescription of  Prednisone for the pt. Follow-up by: Merri Ray CMA Duncan Dull),  January 25, 2010 8:33 AM     Appended Document: Prednisone refill Called pt to inform

## 2010-12-07 NOTE — Assessment & Plan Note (Signed)
Summary: flare up ulcerative colitis...em    History of Present Illness Visit Type: Follow-up Visit Primary GI MD: Melvia Heaps MD [facility] Primary Provider: Evelena Peat, MD Requesting Provider: n/a Chief Complaint: BRB in stool after BMs and diarrhea  History of Present Illness:   Christian Joseph has returned for followup of his left-sided ulcerative colitis.  For a flareup.  Prednisone was discontinued over a month ago.  Consisting of multiple bowel movements daily interspersed with rectal bleeding.  He has some lower abdominal discomfort as well.  He remains on lialda   GI Review of Systems      Denies abdominal pain, acid reflux, belching, bloating, chest pain, dysphagia with liquids, dysphagia with solids, heartburn, loss of appetite, nausea, vomiting, vomiting blood, weight loss, and  weight gain.      Reports diarrhea and  rectal bleeding.     Denies anal fissure, black tarry stools, change in bowel habit, constipation, diverticulosis, fecal incontinence, heme positive stool, hemorrhoids, irritable bowel syndrome, jaundice, light color stool, liver problems, and  rectal pain.    Current Medications (verified): 1)  Robinul-Forte 2 Mg Tabs (Glycopyrrolate) .... Take 1 Tab Two Times A Day As Needed For Cramping and Spasms 2)  Lialda 1.2 Gm Tbec (Mesalamine) .... Four Tablets By Mouth Daily  Allergies (verified): No Known Drug Allergies  Past History:  Past Medical History: Reviewed history from 11/13/2008 and no changes required. 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 Spinal fusion surgery  Family History: Reviewed history from 08/26/2009 and no changes required. unremarkable No FH of Colon Cancer:  Social History: Reviewed history from 11/17/2008 and no changes required. 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  Alcohol Use  - yes - occasionally Daily Caffeine Use Illicit Drug Use - no Patient gets regular exercise.  Review of Systems  The patient denies allergy/sinus, anemia, anxiety-new, arthritis/joint pain, back pain, blood in urine, breast changes/lumps, change in vision, confusion, cough, coughing up blood, depression-new, fainting, fatigue, fever, headaches-new, hearing problems, heart murmur, heart rhythm changes, itching, muscle pains/cramps, night sweats, nosebleeds, shortness of breath, skin rash, sleeping problems, sore throat, swelling of feet/legs, swollen lymph glands, thirst - excessive, urination - excessive, urination changes/pain, urine leakage, vision changes, and voice change.    Vital Signs:  Patient profile:   25 year old male Height:      73 inches Weight:      191 pounds BMI:     25.29 BSA:     2.11 Pulse rate:   72 / minute Pulse rhythm:   regular BP sitting:   104 / 60  (left arm) Cuff size:   regular  Vitals Entered By: Ok Anis CMA (January 07, 2010 10:06 AM)   Impression & Recommendations:  Problem # 1:  Hx of LEFT SIDED ULCERATIVE COLITIS (ICD-556.5) Patient has recurrence of active disease.  Recommendations #1 patient will consider enrollment in a IBD   trial.  Should he decide not to participate, I will place him again on prednisone 20 mg a day and will discuss immunotherapy with him.  Appended Document: flare up ulcerative colitis...em The patient has decided not to participate in a trial.  Accordingly, he will be started on Imuran 50 mg daily and restarted on prednisone 20 mg a day.  He was instructed to call back in 5 days to report his progress.  If he is not significantly improved  I will increase his prednisone to 40 mg daily  Appended Document: Orders Update    Clinical Lists Changes  Orders: Added new Test order of TLB-CBC Platelet - w/Differential (85025-CBCD) - Signed Added new Test order of TLB-Hepatic/Liver Function Pnl (80076-HEPATIC) -  Signed

## 2010-12-07 NOTE — Assessment & Plan Note (Signed)
Summary: F/U Ulcerative Colitis, saw NP    History of Present Illness Visit Type: Follow-up Visit Primary GI MD: Melvia Heaps MD [facility] Primary Provider: Evelena Peat, MD Requesting Provider: n/a Chief Complaint: ulcerative colitis, no improvementt History of Present Illness:   Christian Joseph has returned for followup of his left-sided ulcerative colitis.  Despite high-dose prednisone, azathioprine, Remicade infusions and cort enemas he continues to be very symptomatic with up to 8 bowel movements a day accompanied by urgency and bleeding.  Prior sigmoidoscopy demonstrated an active colitis.  No pseudomembranes were seen I exam or by pathology   GI Review of Systems    Reports abdominal pain and  bloating.     Location of  Abdominal pain: left side.    Denies acid reflux, belching, chest pain, dysphagia with liquids, dysphagia with solids, heartburn, loss of appetite, nausea, vomiting, vomiting blood, weight loss, and  weight gain.      Reports diarrhea and  rectal bleeding.     Denies anal fissure, black tarry stools, change in bowel habit, constipation, diverticulosis, fecal incontinence, heme positive stool, hemorrhoids, irritable bowel syndrome, jaundice, light color stool, liver problems, and  rectal pain.    Current Medications (verified): 1)  Robinul-Forte 2 Mg Tabs (Glycopyrrolate) .... Take 1 Tab Two Times A Day As Needed For Cramping and Spasms 2)  Prednisone 10 Mg Tabs (Prednisone) .... Take Three Tablets By Mouth Once Daily 3)  Azathioprine 50 Mg Tabs (Azathioprine) .... Take 4  Tabs By Mouth Once Daily 4)  Cortenema 100 Mg/39ml Enem (Hydrocortisone) .... Take One Enema At Bedtime 5)  Lialda 1.2 Gm Tbec (Mesalamine) .... Take Two By Mouth Two Times A Day 6)  Remicade 100 Mg Solr (Infliximab) .... As Directed  Allergies (verified): No Known Drug Allergies  Past History:  Past Medical History: Reviewed history from 11/13/2008 and no changes required. Current Problems:    COLONIC POLYPS (ICD-211.3) Hx of LEFT SIDED ULCERATIVE COLITIS (ICD-556.5)  Past Surgical History: Reviewed history from 11/13/2008 and no changes required. Spinal fusion surgery  Family History: Reviewed history from 02/24/2010 and no changes required. No FH of Colon Cancer:  Social History: Reviewed history from 11/17/2008 and no changes required. 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  Alcohol Use - yes - occasionally Daily Caffeine Use Illicit Drug Use - no Patient gets regular exercise.  Review of Systems  The patient denies allergy/sinus, anemia, anxiety-new, arthritis/joint pain, back pain, blood in urine, breast changes/lumps, change in vision, confusion, cough, coughing up blood, depression-new, fainting, fatigue, fever, headaches-new, hearing problems, heart murmur, heart rhythm changes, itching, menstrual pain, muscle pains/cramps, night sweats, nosebleeds, pregnancy symptoms, shortness of breath, skin rash, sleeping problems, sore throat, swelling of feet/legs, swollen lymph glands, thirst - excessive , urination - excessive , urination changes/pain, urine leakage, vision changes, and voice change.    Vital Signs:  Patient profile:   25 year old male Height:      73 inches Weight:      197.25 pounds BMI:     26.12 Pulse rate:   60 / minute Pulse rhythm:   regular BP sitting:   106 / 64  (right arm) Cuff size:   regular  Vitals Entered By: June McMurray CMA Duncan Dull) (May 14, 2010 8:16 AM)   Impression & Recommendations:  Problem # 1:  ULCERATIVE COLITIS, LEFT SIDED (ICD-556.5) Assessment Unchanged At this point Christian Joseph is relatively refractory to medical therapy.  We had a detailed discussion about total colectomy with  ileal anal pull through.  He is agreeable to pursuing this further and will see Dr. Mirian Mo at [facility].  In the interim he will continue his current medications and stools  will be checked for C. difficile toxin.  Other  Orders: T-Culture, C-Diff Toxin A/B ([id number])  Patient Instructions: 1)  Copy sent to : Bruce Burchette,MD 2)  You need to go to the basement with lab sheet today 3)  We will do your referral to [facility] to see Dr Mirian Mo , We will contact  you with an appointment 4)  The medication list was reviewed and reconciled.  All changed / newly prescribed medications were explained.  A complete medication list was provided to the patient / caregiver. 5)  Dr. Mirian Mo  Appended Document: F/U Ulcerative Colitis, saw NP ppointment with Dr Mirian Mo is scheduled for 06/24/2010 at 1:30pm, pt will recieve packet from [facility] in mail  Appended Document: Orders Update    Clinical Lists Changes  Orders: Added new Test order of [facility]) - Signed

## 2011-01-26 LAB — CBC
HCT: 39.5 % (ref 39.0–52.0)
HCT: 39.7 % (ref 39.0–52.0)
Hemoglobin: 13.5 g/dL (ref 13.0–17.0)
Hemoglobin: 13.6 g/dL (ref 13.0–17.0)
MCHC: 34 g/dL (ref 30.0–36.0)
MCHC: 34.5 g/dL (ref 30.0–36.0)
MCV: 85.3 fL (ref 78.0–100.0)
MCV: 85.8 fL (ref 78.0–100.0)
Platelets: 359 10*3/uL (ref 150–400)
RBC: 4.63 MIL/uL (ref 4.22–5.81)
RDW: 12.7 % (ref 11.5–15.5)
RDW: 13 % (ref 11.5–15.5)

## 2011-01-26 LAB — CLOSTRIDIUM DIFFICILE EIA: C difficile Toxins A+B, EIA: NEGATIVE

## 2011-01-28 LAB — DIFFERENTIAL
Basophils Absolute: 0 10*3/uL (ref 0.0–0.1)
Basophils Relative: 0 % (ref 0–1)
Eosinophils Absolute: 0 10*3/uL (ref 0.0–0.7)
Monocytes Relative: 3 % (ref 3–12)
Neutro Abs: 7.3 10*3/uL (ref 1.7–7.7)
Neutrophils Relative %: 88 % — ABNORMAL HIGH (ref 43–77)

## 2011-01-28 LAB — FECAL LACTOFERRIN, QUANT: Fecal Lactoferrin: POSITIVE

## 2011-01-28 LAB — BASIC METABOLIC PANEL
BUN: 9 mg/dL (ref 6–23)
CO2: 32 mEq/L (ref 19–32)
Calcium: 9 mg/dL (ref 8.4–10.5)
Calcium: 9.3 mg/dL (ref 8.4–10.5)
Creatinine, Ser: 1.17 mg/dL (ref 0.4–1.5)
Creatinine, Ser: 1.27 mg/dL (ref 0.4–1.5)
GFR calc Af Amer: >60 mL/min (ref >60)
GFR calc non Af Amer: >60 mL/min (ref >60)
GFR calc non Af Amer: >60 mL/min (ref >60)

## 2011-01-28 LAB — COMPREHENSIVE METABOLIC PANEL
ALT: 13 U/L (ref 0–53)
Albumin: 3.7 g/dL (ref 3.5–5.2)
Albumin: 3.8 g/dL (ref 3.5–5.2)
Alkaline Phosphatase: 56 U/L (ref 39–117)
BUN: 14 mg/dL (ref 6–23)
BUN: 8 mg/dL (ref 6–23)
Calcium: 9.1 mg/dL (ref 8.4–10.5)
Chloride: 99 mEq/L (ref 96–112)
Glucose, Bld: 129 mg/dL — ABNORMAL HIGH (ref 70–99)
Glucose, Bld: 98 mg/dL (ref 70–99)
Potassium: 4.1 mEq/L (ref 3.5–5.1)
Sodium: 138 mEq/L (ref 135–145)
Sodium: 139 mEq/L (ref 135–145)
Total Bilirubin: 0.6 mg/dL (ref 0.3–1.2)
Total Protein: 6.4 g/dL (ref 6.0–8.3)
Total Protein: 6.5 g/dL (ref 6.0–8.3)

## 2011-01-28 LAB — CBC
MCHC: 34.2 g/dL (ref 30.0–36.0)
MCHC: 34.5 g/dL (ref 30.0–36.0)
MCV: 84.8 fL (ref 78.0–100.0)
RBC: 4.58 MIL/uL (ref 4.22–5.81)
RBC: 4.75 MIL/uL (ref 4.22–5.81)
RDW: 13.1 % (ref 11.5–15.5)
WBC: 9.7 10*3/uL (ref 4.0–10.5)

## 2011-01-28 LAB — CLOSTRIDIUM DIFFICILE EIA: C difficile Toxins A+B, EIA: NEGATIVE

## 2011-01-28 LAB — STOOL CULTURE

## 2011-03-07 ENCOUNTER — Other Ambulatory Visit: Payer: Self-pay | Admitting: Orthopedic Surgery

## 2011-03-07 DIAGNOSIS — M545 Low back pain: Secondary | ICD-10-CM

## 2011-03-09 ENCOUNTER — Ambulatory Visit
Admission: RE | Admit: 2011-03-09 | Discharge: 2011-03-09 | Disposition: A | Discharge status: Home / Self Care | Payer: BC Managed Care – PPO | Source: Ambulatory Visit | Attending: Orthopedic Surgery | Admitting: Orthopedic Surgery

## 2011-03-09 DIAGNOSIS — M545 Low back pain: Secondary | ICD-10-CM

## 2011-03-10 ENCOUNTER — Encounter: Payer: Self-pay | Admitting: *Deleted

## 2011-03-10 NOTE — Telephone Encounter (Signed)
ERROR

## 2011-03-25 NOTE — Op Note (Signed)
[place]. [facility]  Patient:    Christian Joseph, Christian Joseph Visit Number: [id number] MRN: [id number]          Service Type: SUR Location: [id number] Attending Physician:  Emeterio Reeve Dictated by:   Payton Doughty, M.D. Proc. 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  OPERATION PERFORMED:  L4-5 laminectomy, diskectomy, posterior lumbar interbody fusion with Ray threaded fusion cage.  SURGEON:  Payton Doughty, M.D.  ANESTHESIA:  General endotracheal.  PREP:  Sterile Betadine prep and scrub with alcohol wipe.  COMPLICATIONS:  None.  ASSISTANT: 1. Cristi Loron, M.D. 2. [place].  INDICATIONS FOR PROCEDURE:  The patient is a 25 year old gentleman with spondylolysis and herniated disk at 4-5.  DESCRIPTION OF PROCEDURE:  The patient was taken to the operating room, smoothly anesthetized and intubated, and placed prone on the operating table. Following shave, prep and drape in the usual sterile fashion, the skin was infiltrated with 1% lidocaine, 1:400,000 epinephrine.  The skin was incised from the bottom of L5 to the top of L4 and the lamina of L4 was exposed bilaterally in the subperiosteal plane.  Intraoperative x-ray confirmed correctness of the level.  The pars interarticularis was defective on both sides, so the lamina and inferior facet of L5 were removed bilaterally using the high speed drill and preserved facet joint set aside for bone grafting. The superior facet of L5 was removed.  This allowed access to the lateral recess.  Careful dissection revealed the 4 root as it coursed around the pedicle.  It was significantly compressed anteriorly by disk and posteriorly by abundant redundant tissue  associated with the pars defect.  Complete decompression of both 4 roots was carried out.  5 roots were identified and retracted medially.  The diskectomy was carried out.  There was a large fragment of disk behind the posterior longitudinal ligament that had extruded and was partly calcified.  This was removed without difficulty.  The disk space was carefully evacuated.  The end plates curetted to remove the cartilaginous end plate.  Ray threaded fusion cages 16 x 21 mm were placed. Intraoperative x-ray showed good placement of cages.  They were packed with bone graft harvested from the facet joints.  Other bone was packed around them.  The wound was irrigated.  Hemostasis assured.  The fascia was reapproximated with 0 Vicryl in interrupted fashion.  Subcutaneous tissues were reapproximated with 0 Vicryl in interrupted fashion.  The skin was closed with 3-0 nylon in running locked fashion.  Bacitracin and Telfa dressing was applied and made occlusive with Op-Site.  The patient then returned to recovery room in good condition. Dictated by:   Payton Doughty, M.D. Attending Physician:  Emeterio Reeve DD:  04/30/02 TD:  05/01/02 Job: [id number]

## 2011-03-25 NOTE — H&P (Signed)
[place]. [facility]  Patient:    Christian Joseph, Christian Joseph Visit Number: [id number] MRN: [id number]          Service Type: Attending:  Payton Doughty, M.D. Dictated by:   Payton Doughty, M.D. Adm. Date:  04/30/02                           History and Physical  ADMISSION DIAGNOSIS:  Lumbar spondylolysis of L4 with an L4 on L5 spondylolisthesis and a herniated disk at L4-5.  HISTORY OF PRESENT ILLNESS:  A 25 year old, right-handed, white gentleman who in April was power lifting, bend over, and felt a snap in his back.  He had in his back and it is coming down his right leg.  CT scan showed a disk and spondylolysis of L4.  He was referred to me.  MRI showed a 10 mm subluxation of L4 on L5 and a large disk at L4-5, narrowing its canal down to about a quarter of its normal size.  He wishes to participate in contact sports.  I told him that I did not feel it was safe for him to do it this way.  Because of the amount of slip that he has, he is now admitted for decompression, diskectomy, and stabilization with Ray cages.  PAST MEDICAL HISTORY:  Unremarkable.  PAST SURGICAL HISTORY:  None.  ALLERGIES:  None.  SOCIAL HISTORY:  He does not smoke or drink.  He is a Consulting civil engineer at [employer].  FAMILY HISTORY:  His mother is 38 and his father is 59.  Both are in good health.  REVIEW OF SYSTEMS:  Remarkable for leg weakness, back pain, and leg pain.  No joint pain or swelling.  PHYSICAL EXAMINATION:  His HEENT exam is within normal limits.  NECK:  He has good range of motion.  CHEST:  Clear.  CARDIAC:  Regular rate and rhythm.  ABDOMEN:  Nontender with no hepatosplenomegaly.  EXTREMITIES:  Without clubbing or cyanosis.  GENITOURINARY:  Exam is deferred.  PERIPHERAL PULSES:  Good.  NEUROLOGIC:  He is awake, alert, and oriented.  His cranial nerves are intact. Motor exam shows 5/5 strength throughout the upper and lower extremities. Sensory deficit  described in the right L4 distribution.  Reflexes are 1 at the knees and 1 at the ankles.  Toes downgoing bilaterally.  Straight leg raise is negative.  He can bend forward with good flexibility.  Quick recovery causes back pain.  Extension does not bother him much.  The MRI results have been reviewed above.  CLINICAL IMPRESSION: 1. Spondylolysis at L4. 2. Grade 1 spondylolisthesis of L4-5. 3. Herniated disk at L4-5.  PLAN:  Lumbar laminectomy and diskectomy with a posterior lumbar interbody fusion.  I feel that at this age it will not be necessary to place posterior instrumentation, although if for some reason reduction cannot be obtained or there is any question of instability there will be augmentation with pedicle screws.  The risks and benefits of this approach have been extensively discussed with both he and his parents and they wish to proceed. Dictated by:   Payton Doughty, M.D. Attending:  Payton Doughty, M.D. DD:  04/30/02 TD:  04/30/02 Job: [id number]

## 2011-03-25 NOTE — Assessment & Plan Note (Signed)
Christian Joseph                           GASTROENTEROLOGY OFFICE NOTE   Christian Joseph, Christian Joseph                       MRN:          [id number]  DATE:05/24/2006                            DOB:          1986/10/22    PROBLEM:  Colitis.   [name] has returned for reevaluation.  He has a history of left-sided  colitis that is probably Crohn's disease.  He has been taking Asacol  intermittently.  Over the last few weeks he has had a flare-up consisting of  abdominal pain and bloody diarrhea.  Symptoms have slightly improved since  resuming his Asacol 800 three times a day.   PHYSICAL EXAMINATION:  VITAL SIGNS:  Pulse 52, blood pressure 80/52, weight  177.  HEENT: EOMI. PERRLA. Sclerae are anicteric.  Conjunctivae are pink.  NECK:  Supple without thyromegaly, adenopathy or carotid bruits.  CHEST:  Clear to auscultation and percussion without adventitious sounds.  CARDIAC:  Regular rhythm; normal S1 S2.  There are no murmurs, gallops or  rubs.  ABDOMEN:  Bowel sounds are normoactive.  Abdomen is soft, non-tender and non-  distended.  There are no abdominal masses, tenderness, splenic enlargement  or hepatomegaly.  EXTREMITIES:  Full range of motion.  No cyanosis, clubbing or edema.  RECTAL:  Deferred.   IMPRESSION:  Flare-up of Crohn's colitis.   RECOMMENDATION:  Increase Asacol to 1600 mg three times a day.  Patient was  carefully instructed to call in five days if he is not improved at which  point I would consider prednisone and mesalamine enemas.                                   Barbette Hair. Arlyce Dice, MD, [facility]   RDK/MedQ  DD:  05/24/2006  DT:  05/24/2006  Job #:  [id number]   cc:   Teena Irani. Arlyce Dice, MD

## 2015-04-10 ENCOUNTER — Encounter: Payer: Self-pay | Admitting: Gastroenterology

## 2015-12-07 ENCOUNTER — Encounter: Payer: Self-pay | Admitting: Neurology

## 2015-12-07 ENCOUNTER — Ambulatory Visit (INDEPENDENT_AMBULATORY_CARE_PROVIDER_SITE_OTHER): Payer: 59 | Admitting: Neurology

## 2015-12-07 VITALS — BP 116/68 | HR 64 | Ht 73.0 in | Wt 198.0 lb

## 2015-12-07 DIAGNOSIS — R202 Paresthesia of skin: Secondary | ICD-10-CM

## 2015-12-07 HISTORY — DX: Paresthesia of skin: R20.2

## 2015-12-07 NOTE — Patient Instructions (Signed)
Paresthesia Paresthesia is an abnormal burning or prickling sensation. This sensation is generally felt in the hands, arms, legs, or feet. However, it may occur in any part of the body. Usually, it is not painful. The feeling may be described as:  Tingling or numbness.  Pins and needles.  Skin crawling.  Buzzing.  Limbs falling asleep.  Itching. Most people experience temporary (transient) paresthesia at some time in their lives. Paresthesia may occur when you breathe too quickly (hyperventilation). It can also occur without any apparent cause. Commonly, paresthesia occurs when pressure is placed on a nerve. The sensation quickly goes away after the pressure is removed. For some people, however, paresthesia is a long-lasting (chronic) condition that is caused by an underlying disorder. If you continue to have paresthesia, you may need further medical evaluation. HOME CARE INSTRUCTIONS Watch your condition for any changes. Taking the following actions may help to lessen any discomfort that you are feeling:  Avoid drinking alcohol.  Try acupuncture or massage to help relieve your symptoms.  Keep all follow-up visits as directed by your health care provider. This is important. SEEK MEDICAL CARE IF:  You continue to have episodes of paresthesia.  Your burning or prickling feeling gets worse when you walk.  You have pain, cramps, or dizziness.  You develop a rash. SEEK IMMEDIATE MEDICAL CARE IF:  You feel weak.  You have trouble walking or moving.  You have problems with speech, understanding, or vision.  You feel confused.  You cannot control your bladder or bowel movements.  You have numbness after an injury.  You faint.   This information is not intended to replace advice given to you by your health care provider. Make sure you discuss any questions you have with your health care provider.   Document Released: 10/14/2002 Document Revised: 03/10/2015 Document Reviewed:  10/20/2014 Elsevier Interactive Patient Education 2016 Elsevier Inc.  

## 2015-12-07 NOTE — Progress Notes (Signed)
Reason for visit: Paresthesias  Referring physician: Dr. Denzil Hughes Christian Joseph is a 30 y.o. male  History of present illness:  Christian Joseph is a 30 year old right-handed white male with a history of onset of paresthesias involving all 4 extremities that began about one month ago. The patient indicated that the symptoms came on over several days, and have not changed much since they started. The patient has also noted a change in taste sensation, not true numbness of the tongue. He denies any weakness of the extremities, and he denies balance issues. There have been no changes in bowel or bladder control. The patient indicates that he cannot alter the tingling sensations and numb sensations by flexing the neck or turning the head. He denies any systemic symptoms such as fevers, chills, or fatigue. The patient denies any viral type illnesses within a couple weeks prior to onset of symptoms. He does have some chronic low back pain, he has had prior lumbosacral spine surgery. He has some minimal neck discomfort as well. He denies pain going down the arms or legs. The patient denies any visual disturbances, or double vision. He is sent to this office for an evaluation. Blood work has been done to include a comprehensive metabolic profile, CBC, hemoglobin A1c, and vitamin B12 level. The blood studies were unremarkable. The patient does report that he has been bitten by ticks in the past.  Past Medical History  Diagnosis Date  . Ulcerative colitis   . Paresthesia 12/07/2015    Past Surgical History  Procedure Laterality Date  . Back surgery    . Coloproctectomy w/ ileo j pouch      History reviewed. No pertinent family history.  Social history:  reports that he has never smoked. His smokeless tobacco use includes Snuff. He reports that he does not drink alcohol or use illicit drugs.  Medications:  Prior to Admission medications   Not on File     No Known Allergies  ROS:  Out of a  complete 14 system review of symptoms, the patient complains only of the following symptoms, and all other reviewed systems are negative.  Numbness  Blood pressure 116/68, pulse 64, height  (1.854 m), weight 198 lb (89.812 kg).  Physical Exam  General: The patient is alert and cooperative at the time of the examination.  Eyes: Pupils are equal, round, and reactive to light. Discs are flat bilaterally.  Neck: The neck is supple, no carotid bruits are noted.  Respiratory: The respiratory examination is clear.  Cardiovascular: The cardiovascular examination reveals a regular rate and rhythm, no obvious murmurs or rubs are noted.  Skin: Extremities are without significant edema.  Neurologic Exam  Mental status: The patient is alert and oriented x 3 at the time of the examination. The patient has apparent normal recent and remote memory, with an apparently normal attention span and concentration ability.  Cranial nerves: Facial symmetry is present. There is good sensation of the face to pinprick and soft touch bilaterally. The strength of the facial muscles and the muscles to head turning and shoulder shrug are normal bilaterally. Speech is well enunciated, no aphasia or dysarthria is noted. Extraocular movements are full. Visual fields are full. The tongue is midline, and the patient has symmetric elevation of the soft palate. No obvious hearing deficits are noted.  Motor: The motor testing reveals 5 over 5 strength of all 4 extremities. Good symmetric motor tone is noted throughout.  Sensory: Sensory testing  is intact to pinprick, soft touch, vibration sensation, and position sense on all 4 extremities. No evidence of extinction is noted.  Coordination: Cerebellar testing reveals good finger-nose-finger and heel-to-shin bilaterally.  Gait and station: Gait is normal. Tandem gait is normal. Romberg is negative. No drift is seen.  Reflexes: Deep tendon reflexes are symmetric and  normal bilaterally. Toes are downgoing bilaterally.   Assessment/Plan:  1. Paresthesias  The patient has reported sensory alteration on all 4 extremities and some alteration in taste sensation that began about one month ago. The clinical examination today is unremarkable. The patient has a history of ulcerative colitis, but this issue has in well controlled recently. The patient will need an evaluation to exclude demyelinating disease. He will be set up for MRI of the brain and cervical spine with and without gadolinium enhancement. Depending upon the results of the above study, further blood work may be done.  Christian Palau MD 12/07/2015 6:07 PM  [facility] [address] [place], [place] [address]  Phone [phone] Fax [phone]

## 2015-12-09 ENCOUNTER — Ambulatory Visit: Payer: Self-pay | Admitting: Neurology

## 2015-12-23 ENCOUNTER — Ambulatory Visit (INDEPENDENT_AMBULATORY_CARE_PROVIDER_SITE_OTHER): Payer: 59

## 2015-12-23 DIAGNOSIS — R202 Paresthesia of skin: Secondary | ICD-10-CM | POA: Diagnosis not present

## 2015-12-23 MED ORDER — GADOPENTETATE DIMEGLUMINE 469.01 MG/ML IV SOLN: 19.0000 mL | Freq: Once | INTRAVENOUS | Status: AC | PRN

## 2015-12-27 ENCOUNTER — Telehealth: Payer: Self-pay | Admitting: Neurology

## 2015-12-27 DIAGNOSIS — R202 Paresthesia of skin: Secondary | ICD-10-CM

## 2015-12-27 NOTE — Telephone Encounter (Signed)
  I called the patient. The MRI scans of the head and neck were OK. I will check further blood work.   MRI cervical spine 12/23/15:  IMPRESSION: Slightly abnormal MRI scan of the cervical spine showing minor disc degenerative changes at C4-5 and C5-6 but without significant compression.   MRI brain 12/23/15:  IMPRESSION: Unremarkable MRI scan of the brain with and without contrast. 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

## 2016-01-07 ENCOUNTER — Other Ambulatory Visit (INDEPENDENT_AMBULATORY_CARE_PROVIDER_SITE_OTHER): Payer: Self-pay

## 2016-01-07 DIAGNOSIS — Z0289 Encounter for other administrative examinations: Secondary | ICD-10-CM

## 2016-01-07 DIAGNOSIS — R202 Paresthesia of skin: Secondary | ICD-10-CM

## 2016-01-12 ENCOUNTER — Telehealth: Payer: Self-pay | Admitting: Neurology

## 2016-01-12 LAB — LYME, WESTERN BLOT, SERUM (REFLEXED)
IGG P18 AB.: ABSENT
IGG P23 AB.: ABSENT
IGG P30 AB.: ABSENT
IGG P66 AB.: ABSENT
IGG P93 AB.: ABSENT
IgG P28 Ab.: ABSENT
IgG P45 Ab.: ABSENT
IgG P58 Ab.: ABSENT
IgM P23 Ab.: ABSENT
LYME IGG WB: NEGATIVE
Lyme IgM Wb: POSITIVE — AB

## 2016-01-12 LAB — ANGIOTENSIN CONVERTING ENZYME: Angio Convert Enzyme: 47 U/L (ref 14–82)

## 2016-01-12 LAB — B. BURGDORFI ANTIBODIES: LYME IGG/IGM AB: 1.05 {ISR} — AB (ref 0.00–0.90)

## 2016-01-12 LAB — HIV ANTIBODY (ROUTINE TESTING W REFLEX): HIV SCREEN 4TH GENERATION: NONREACTIVE

## 2016-01-12 LAB — COPPER, SERUM: COPPER: 109 ug/dL (ref 72–166)

## 2016-01-12 LAB — RHEUMATOID FACTOR: RHEUMATOID FACTOR: 12.7 [IU]/mL (ref 0.0–13.9)

## 2016-01-12 LAB — RPR: RPR: NONREACTIVE

## 2016-01-12 LAB — ANA W/REFLEX: Anti Nuclear Antibody(ANA): NEGATIVE

## 2016-01-12 LAB — SEDIMENTATION RATE: Sed Rate: 2 mm/hr (ref 0–15)

## 2016-01-12 MED ORDER — DOXYCYCLINE HYCLATE 100 MG PO CAPS: 100.0000 mg | ORAL_CAPSULE | Freq: Two times a day (BID) | ORAL | Status: DC

## 2016-01-12 NOTE — Telephone Encounter (Signed)
I called the patient. The Lyme antibody panel is consistent with early infection of Lyme disease. I will call in Oral antibiotics for him. I'm not sure that this is the etiology of his sensory complaints. Otherwise, the blood work was unremarkable.

## 2017-02-07 ENCOUNTER — Other Ambulatory Visit: Payer: Self-pay | Admitting: Neurological Surgery

## 2017-02-07 ENCOUNTER — Other Ambulatory Visit (HOSPITAL_COMMUNITY): Payer: Self-pay | Admitting: Neurological Surgery

## 2017-02-07 DIAGNOSIS — M5416 Radiculopathy, lumbar region: Secondary | ICD-10-CM

## 2017-02-15 ENCOUNTER — Ambulatory Visit (HOSPITAL_COMMUNITY)
Admission: RE | Admit: 2017-02-15 | Discharge: 2017-02-15 | Disposition: A | Discharge status: Home / Self Care | Payer: 59 | Source: Ambulatory Visit | Attending: Neurological Surgery | Admitting: Neurological Surgery

## 2017-02-15 ENCOUNTER — Encounter (HOSPITAL_COMMUNITY): Payer: Self-pay

## 2017-02-15 DIAGNOSIS — M5127 Other intervertebral disc displacement, lumbosacral region: Secondary | ICD-10-CM | POA: Insufficient documentation

## 2017-02-15 DIAGNOSIS — Z981 Arthrodesis status: Secondary | ICD-10-CM | POA: Diagnosis not present

## 2017-02-15 DIAGNOSIS — M4726 Other spondylosis with radiculopathy, lumbar region: Secondary | ICD-10-CM | POA: Diagnosis not present

## 2017-02-15 DIAGNOSIS — M4316 Spondylolisthesis, lumbar region: Secondary | ICD-10-CM | POA: Insufficient documentation

## 2017-02-15 DIAGNOSIS — M5416 Radiculopathy, lumbar region: Secondary | ICD-10-CM

## 2017-02-15 MED ORDER — IOPAMIDOL (ISOVUE-M 200) INJECTION 41%
INTRAMUSCULAR | Status: AC
  Administered 2017-02-15: 12 mL via INTRATHECAL
  Filled 2017-02-15: qty 10

## 2017-02-15 MED ORDER — LIDOCAINE HCL (PF) 1 % IJ SOLN
5.0000 mL | Freq: Once | INTRAMUSCULAR | Status: AC
  Administered 2017-02-15: 5 mL via INTRADERMAL

## 2017-02-15 MED ORDER — HYDROCODONE-ACETAMINOPHEN 5-325 MG PO TABS: 1.0000 | ORAL_TABLET | ORAL | Status: DC | PRN

## 2017-02-15 MED ORDER — ONDANSETRON HCL 4 MG/2ML IJ SOLN: 4.0000 mg | Freq: Four times a day (QID) | INTRAMUSCULAR | Status: DC | PRN

## 2017-02-15 MED ORDER — DIAZEPAM 5 MG PO TABS
ORAL_TABLET | ORAL | Status: AC
  Administered 2017-02-15: 10 mg via ORAL
  Filled 2017-02-15: qty 2

## 2017-02-15 MED ORDER — IOPAMIDOL (ISOVUE-M 200) INJECTION 41%
20.0000 mL | Freq: Once | INTRAMUSCULAR | Status: AC
  Administered 2017-02-15: 12 mL via INTRATHECAL

## 2017-02-15 MED ORDER — DIAZEPAM 5 MG PO TABS
10.0000 mg | ORAL_TABLET | Freq: Once | ORAL | Status: AC
  Administered 2017-02-15: 10 mg via ORAL

## 2017-02-15 MED ORDER — LIDOCAINE HCL 1 % IJ SOLN
INTRAMUSCULAR | Status: AC
  Filled 2017-02-15: qty 10

## 2017-02-15 NOTE — Discharge Instructions (Signed)
Myelogram, Care After °These instructions give you information about caring for yourself after your procedure. Your doctor may also give you more specific instructions. Call your doctor if you have any problems or questions after your procedure. °Follow these instructions at home: °· Drink enough fluid to keep your pee (urine) clear or pale yellow. °· Rest as told by your doctor. °· Lie flat with your head slightly raised (elevated). °· Do not bend, lift, or do any hard activities for 24-48 hours or as told by your doctor. °· Take over-the-counter and prescription medicines only as told by your doctor. °· Take care of and remove your bandage (dressing) as told by your doctor. °· Bathe or shower as told by your doctor. °Contact a health care provider if: °· You have a fever. °· You have a headache that lasts longer than 24 hours. °· You feel sick to your stomach (nauseous). °· You throw up (vomit). °· Your neck is stiff. °· Your legs feel numb. °· You cannot pee. °· You cannot poop (have a bowel movement). °· You have a rash. °· You are itchy or sneezing. °Get help right away if: °· You have new symptoms or your symptoms get worse. °· You have a seizure. °· You have trouble breathing. °This information is not intended to replace advice given to you by your health care provider. Make sure you discuss any questions you have with your health care provider. °Document Released: 08/02/2008 Document Revised: 06/23/2016 Document Reviewed: 08/06/2015 °Elsevier Interactive Patient Education © 2017 Elsevier Inc. ° °

## 2017-02-15 NOTE — Procedures (Signed)
Christian Joseph is a 31 year old individual who had previous Ray cage arthrodesis at L4-L5. When the cages has been retropulsed for a long period of time but now is having increasing left lower extremity pain. Question of whether he has a pseudoarthrosis is also entertained CT myelogram is now being performed to evaluate this process better.   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 Further evaluation with CT scan

## 2017-03-03 ENCOUNTER — Other Ambulatory Visit: Payer: Self-pay | Admitting: Neurological Surgery

## 2017-03-14 NOTE — Pre-Procedure Instructions (Signed)
Christian Joseph  03/14/2017      [id number] - [place], [place] - [address] [place] [address] Phone: [phone] Fax: [phone]    Your procedure is scheduled on Tues. May 15  [facility] Admitting at 9:35 A.M.  Call this number if you have problems the morning of surgery:  [phone]   Remember:  Do not eat food or drink liquids after midnight on Mon. May 14   Take these medicines the morning of surgery with A SIP OF WATER :none             1 week prior to surgery don't take aspirin, advil, motrin, ibuprofen, aleve, BC Powders, Goody's, vitamins/herbal medicines.   Do not wear jewelry.  Do not wear lotions, powders, or perfumes, or deoderant.  Do not shave 48 hours prior to surgery.  Men may shave face and neck.  Do not bring valuables to the hospital.  [facility] is not responsible for any belongings or valuables.  Contacts, dentures or bridgework may not be worn into surgery.  Leave your suitcase in the car.  After surgery it may be brought to your room.  For patients admitted to the hospital, discharge time will be determined by your treatment team.  Patients discharged the day of surgery will not be allowed to drive home.    Special instructions:  [facility]- Preparing For Surgery  Before surgery, you can play an important role. Because skin is not sterile, your skin needs to be as free of germs as possible. You can reduce the number of germs on your skin by washing with CHG (chlorahexidine gluconate) Soap before surgery.  CHG is an antiseptic cleaner which kills germs and bonds with the skin to continue killing germs even after washing.  Please do not use if you have an allergy to CHG or antibacterial soaps. If your skin becomes reddened/irritated stop using the CHG.  Do not shave (including legs and underarms) for at least 48 hours prior to first CHG shower. It is OK  to shave your face.  Please follow these instructions carefully.   1. Shower the NIGHT BEFORE SURGERY and the MORNING OF SURGERY with CHG.   2. If you chose to wash your hair, wash your hair first as usual with your normal shampoo.  3. After you shampoo, rinse your hair and body thoroughly to remove the shampoo.  4. Use CHG as you would any other liquid soap. You can apply CHG directly to the skin and wash gently with a scrungie or a clean washcloth.   5. Apply the CHG Soap to your body ONLY FROM THE NECK DOWN.  Do not use on open wounds or open sores. Avoid contact with your eyes, ears, mouth and genitals (private parts). Wash genitals (private parts) with your normal soap.  6. Wash thoroughly, paying special attention to the area where your surgery will be performed.  7. Thoroughly rinse your body with warm water from the neck down.  8. DO NOT shower/wash with your normal soap after using and rinsing off the CHG Soap.  9. Pat yourself dry with a CLEAN TOWEL.   10. Wear CLEAN PAJAMAS   11. Place CLEAN SHEETS on your bed the night of your first shower and DO NOT SLEEP WITH PETS.    Day of Surgery: Do not apply any deodorants/lotions. Please wear clean clothes to  the hospital/surgery center.      Please read over the following fact sheets that you were given. Coughing and Deep Breathing, MRSA Information and Surgical Site Infection Prevention

## 2017-03-15 ENCOUNTER — Encounter (HOSPITAL_COMMUNITY): Payer: Self-pay

## 2017-03-15 ENCOUNTER — Encounter (HOSPITAL_COMMUNITY)
Admission: RE | Admit: 2017-03-15 | Discharge: 2017-03-15 | Disposition: A | Discharge status: Home / Self Care | Payer: 59 | Source: Ambulatory Visit | Attending: Neurological Surgery | Admitting: Neurological Surgery

## 2017-03-15 DIAGNOSIS — Z01812 Encounter for preprocedural laboratory examination: Secondary | ICD-10-CM | POA: Insufficient documentation

## 2017-03-15 DIAGNOSIS — M5416 Radiculopathy, lumbar region: Secondary | ICD-10-CM | POA: Insufficient documentation

## 2017-03-15 DIAGNOSIS — Z0183 Encounter for blood typing: Secondary | ICD-10-CM | POA: Diagnosis not present

## 2017-03-15 HISTORY — DX: Bradycardia, unspecified: R00.1

## 2017-03-15 LAB — BASIC METABOLIC PANEL
ANION GAP: 7 (ref 5–15)
BUN: 13 mg/dL (ref 6–20)
CO2: 27 mmol/L (ref 22–32)
Calcium: 9.2 mg/dL (ref 8.9–10.3)
Chloride: 105 mmol/L (ref 101–111)
Creatinine, Ser: 1.36 mg/dL — ABNORMAL HIGH (ref 0.61–1.24)
GFR calc Af Amer: >60 mL/min (ref >60)
GFR calc non Af Amer: >60 mL/min (ref >60)
GLUCOSE: 93 mg/dL (ref 65–99)
POTASSIUM: 4.4 mmol/L (ref 3.5–5.1)
Sodium: 139 mmol/L (ref 135–145)

## 2017-03-15 LAB — CBC
HCT: 44.7 % (ref 39.0–52.0)
Hemoglobin: 15.3 g/dL (ref 13.0–17.0)
MCH: 29.3 pg (ref 26.0–34.0)
MCHC: 34.2 g/dL (ref 30.0–36.0)
MCV: 85.5 fL (ref 78.0–100.0)
PLATELETS: 325 10*3/uL (ref 150–400)
RBC: 5.23 MIL/uL (ref 4.22–5.81)
RDW: 12.8 % (ref 11.5–15.5)
WBC: 9.9 10*3/uL (ref 4.0–10.5)

## 2017-03-15 LAB — TYPE AND SCREEN
ABO/RH(D): B POS
ANTIBODY SCREEN: NEGATIVE

## 2017-03-15 LAB — ABO/RH: ABO/RH(D): B POS

## 2017-03-15 LAB — SURGICAL PCR SCREEN
MRSA, PCR: NEGATIVE
Staphylococcus aureus: POSITIVE — AB

## 2017-03-21 ENCOUNTER — Encounter (HOSPITAL_COMMUNITY): Payer: Self-pay | Admitting: General Practice

## 2017-03-21 ENCOUNTER — Inpatient Hospital Stay (HOSPITAL_COMMUNITY): Payer: 59 | Admitting: Critical Care Medicine

## 2017-03-21 ENCOUNTER — Inpatient Hospital Stay (HOSPITAL_COMMUNITY): Payer: 59

## 2017-03-21 ENCOUNTER — Inpatient Hospital Stay (HOSPITAL_COMMUNITY)
Admission: RE | Admit: 2017-03-21 | Discharge: 2017-03-22 | DRG: 455 | Disposition: A | Discharge status: Home / Self Care | Payer: 59 | Source: Ambulatory Visit | Attending: Neurological Surgery | Admitting: Neurological Surgery

## 2017-03-21 ENCOUNTER — Inpatient Hospital Stay (HOSPITAL_COMMUNITY)
Admission: RE | Disposition: A | Discharge status: Home / Self Care | Payer: Self-pay | Source: Ambulatory Visit | Attending: Neurological Surgery

## 2017-03-21 DIAGNOSIS — F17290 Nicotine dependence, other tobacco product, uncomplicated: Secondary | ICD-10-CM | POA: Diagnosis present

## 2017-03-21 DIAGNOSIS — M5117 Intervertebral disc disorders with radiculopathy, lumbosacral region: Secondary | ICD-10-CM | POA: Diagnosis present

## 2017-03-21 DIAGNOSIS — M960 Pseudarthrosis after fusion or arthrodesis: Secondary | ICD-10-CM | POA: Diagnosis present

## 2017-03-21 DIAGNOSIS — M48061 Spinal stenosis, lumbar region without neurogenic claudication: Secondary | ICD-10-CM | POA: Diagnosis present

## 2017-03-21 DIAGNOSIS — Z419 Encounter for procedure for purposes other than remedying health state, unspecified: Secondary | ICD-10-CM

## 2017-03-21 DIAGNOSIS — M4726 Other spondylosis with radiculopathy, lumbar region: Secondary | ICD-10-CM | POA: Diagnosis present

## 2017-03-21 DIAGNOSIS — Y831 Surgical operation with implant of artificial internal device as the cause of abnormal reaction of the patient, or of later complication, without mention of misadventure at the time of the procedure: Secondary | ICD-10-CM | POA: Diagnosis present

## 2017-03-21 DIAGNOSIS — M549 Dorsalgia, unspecified: Secondary | ICD-10-CM | POA: Diagnosis present

## 2017-03-21 SURGERY — POSTERIOR LUMBAR FUSION 2 WITH HARDWARE REMOVAL
Anesthesia: General | Site: Back

## 2017-03-21 MED ORDER — ONDANSETRON HCL 4 MG PO TABS: 4.0000 mg | ORAL_TABLET | Freq: Four times a day (QID) | ORAL | Status: DC | PRN

## 2017-03-21 MED ORDER — GLYCOPYRROLATE 0.2 MG/ML IV SOSY
PREFILLED_SYRINGE | INTRAVENOUS | Status: DC | PRN
  Administered 2017-03-21 (×2): .2 mg via INTRAVENOUS

## 2017-03-21 MED ORDER — HYDROMORPHONE HCL 1 MG/ML IJ SOLN: 1.0000 mg | INTRAMUSCULAR | Status: DC | PRN

## 2017-03-21 MED ORDER — ACETAMINOPHEN 650 MG RE SUPP: 650.0000 mg | RECTAL | Status: DC | PRN

## 2017-03-21 MED ORDER — BACITRACIN 50000 UNITS IM SOLR
INTRAMUSCULAR | Status: DC | PRN
  Administered 2017-03-21: 500 mL

## 2017-03-21 MED ORDER — THROMBIN 5000 UNITS EX SOLR
CUTANEOUS | Status: DC | PRN
  Administered 2017-03-21: 5 mL via TOPICAL

## 2017-03-21 MED ORDER — ROCURONIUM BROMIDE 50 MG/5ML IV SOSY
PREFILLED_SYRINGE | INTRAVENOUS | Status: DC | PRN
  Administered 2017-03-21: 20 mg via INTRAVENOUS
  Administered 2017-03-21: 50 mg via INTRAVENOUS
  Administered 2017-03-21: 20 mg via INTRAVENOUS
  Administered 2017-03-21: 50 mg via INTRAVENOUS

## 2017-03-21 MED ORDER — SODIUM CHLORIDE 0.9 % IV SOLN: INTRAVENOUS | Status: DC

## 2017-03-21 MED ORDER — CHLORHEXIDINE GLUCONATE CLOTH 2 % EX PADS: 6.0000 | MEDICATED_PAD | Freq: Once | CUTANEOUS | Status: DC

## 2017-03-21 MED ORDER — CEFAZOLIN SODIUM 1 G IJ SOLR
INTRAMUSCULAR | Status: AC
  Filled 2017-03-21: qty 40

## 2017-03-21 MED ORDER — LIDOCAINE HCL (CARDIAC) 20 MG/ML IV SOLN
INTRAVENOUS | Status: DC | PRN
  Administered 2017-03-21: 100 mg via INTRAVENOUS

## 2017-03-21 MED ORDER — THROMBIN 20000 UNITS EX SOLR
CUTANEOUS | Status: AC
  Filled 2017-03-21: qty 20000

## 2017-03-21 MED ORDER — ALUM & MAG HYDROXIDE-SIMETH 200-200-20 MG/5ML PO SUSP: 30.0000 mL | Freq: Four times a day (QID) | ORAL | Status: DC | PRN

## 2017-03-21 MED ORDER — METHOCARBAMOL 500 MG PO TABS
ORAL_TABLET | ORAL | Status: AC
  Filled 2017-03-21: qty 1

## 2017-03-21 MED ORDER — MEPERIDINE HCL 25 MG/ML IJ SOLN
INTRAMUSCULAR | Status: AC
  Administered 2017-03-21: 12.5 mg via INTRAVENOUS
  Filled 2017-03-21: qty 1

## 2017-03-21 MED ORDER — METHOCARBAMOL 1000 MG/10ML IJ SOLN
500.0000 mg | Freq: Four times a day (QID) | INTRAVENOUS | Status: DC | PRN
  Filled 2017-03-21: qty 5

## 2017-03-21 MED ORDER — MENTHOL 3 MG MT LOZG
1.0000 | LOZENGE | OROMUCOSAL | Status: DC | PRN
  Filled 2017-03-21: qty 9

## 2017-03-21 MED ORDER — FLEET ENEMA 7-19 GM/118ML RE ENEM: 1.0000 | ENEMA | Freq: Once | RECTAL | Status: DC | PRN

## 2017-03-21 MED ORDER — 0.9 % SODIUM CHLORIDE (POUR BTL) OPTIME
TOPICAL | Status: DC | PRN
  Administered 2017-03-21: 1000 mL

## 2017-03-21 MED ORDER — FENTANYL CITRATE (PF) 250 MCG/5ML IJ SOLN
INTRAMUSCULAR | Status: AC
  Filled 2017-03-21: qty 5

## 2017-03-21 MED ORDER — CEFAZOLIN SODIUM-DEXTROSE 2-4 GM/100ML-% IV SOLN
2.0000 g | INTRAVENOUS | Status: AC
  Administered 2017-03-21: 2 g via INTRAVENOUS
  Filled 2017-03-21: qty 100

## 2017-03-21 MED ORDER — HYDROCODONE-ACETAMINOPHEN 5-325 MG PO TABS: 1.0000 | ORAL_TABLET | Freq: Four times a day (QID) | ORAL | Status: DC | PRN

## 2017-03-21 MED ORDER — LACTATED RINGERS IV SOLN
INTRAVENOUS | Status: DC
  Administered 2017-03-21 (×3): via INTRAVENOUS

## 2017-03-21 MED ORDER — PHENOL 1.4 % MT LIQD: 1.0000 | OROMUCOSAL | Status: DC | PRN

## 2017-03-21 MED ORDER — ROCURONIUM BROMIDE 10 MG/ML (PF) SYRINGE
PREFILLED_SYRINGE | INTRAVENOUS | Status: AC
  Filled 2017-03-21: qty 5

## 2017-03-21 MED ORDER — THROMBIN 5000 UNITS EX SOLR
CUTANEOUS | Status: AC
  Filled 2017-03-21: qty 5000

## 2017-03-21 MED ORDER — ACETAMINOPHEN 325 MG PO TABS: 650.0000 mg | ORAL_TABLET | ORAL | Status: DC | PRN

## 2017-03-21 MED ORDER — HYDROCODONE-ACETAMINOPHEN 5-325 MG PO TABS
ORAL_TABLET | ORAL | Status: AC
  Filled 2017-03-21: qty 2

## 2017-03-21 MED ORDER — MEPERIDINE HCL 25 MG/ML IJ SOLN
12.5000 mg | Freq: Once | INTRAMUSCULAR | Status: AC
  Administered 2017-03-21: 12.5 mg via INTRAVENOUS

## 2017-03-21 MED ORDER — CEFAZOLIN SODIUM-DEXTROSE 2-4 GM/100ML-% IV SOLN
2.0000 g | Freq: Three times a day (TID) | INTRAVENOUS | Status: AC
  Administered 2017-03-21 – 2017-03-22 (×2): 2 g via INTRAVENOUS
  Filled 2017-03-21 (×2): qty 100

## 2017-03-21 MED ORDER — DIPHENHYDRAMINE HCL 50 MG/ML IJ SOLN
INTRAMUSCULAR | Status: DC | PRN
  Administered 2017-03-21: 12.5 mg via INTRAVENOUS

## 2017-03-21 MED ORDER — CEFAZOLIN SODIUM 1 G IJ SOLR
INTRAMUSCULAR | Status: AC
  Filled 2017-03-21: qty 20

## 2017-03-21 MED ORDER — PROPOFOL 10 MG/ML IV BOLUS
INTRAVENOUS | Status: DC | PRN
  Administered 2017-03-21: 200 mg via INTRAVENOUS

## 2017-03-21 MED ORDER — BISACODYL 10 MG RE SUPP: 10.0000 mg | Freq: Every day | RECTAL | Status: DC | PRN

## 2017-03-21 MED ORDER — ONDANSETRON HCL 4 MG/2ML IJ SOLN
INTRAMUSCULAR | Status: DC | PRN
  Administered 2017-03-21: 4 mg via INTRAVENOUS

## 2017-03-21 MED ORDER — ROCURONIUM BROMIDE 10 MG/ML (PF) SYRINGE
PREFILLED_SYRINGE | INTRAVENOUS | Status: AC
Start: 2017-03-21 — End: 2017-03-21
  Filled 2017-03-21: qty 10

## 2017-03-21 MED ORDER — ONDANSETRON HCL 4 MG/2ML IJ SOLN
INTRAMUSCULAR | Status: AC
  Filled 2017-03-21: qty 2

## 2017-03-21 MED ORDER — HYDROMORPHONE HCL 1 MG/ML IJ SOLN: 0.2500 mg | INTRAMUSCULAR | Status: DC | PRN

## 2017-03-21 MED ORDER — FENTANYL CITRATE (PF) 100 MCG/2ML IJ SOLN
INTRAMUSCULAR | Status: DC | PRN
  Administered 2017-03-21: 50 ug via INTRAVENOUS
  Administered 2017-03-21: 150 ug via INTRAVENOUS
  Administered 2017-03-21 (×6): 50 ug via INTRAVENOUS

## 2017-03-21 MED ORDER — BUPIVACAINE HCL (PF) 0.5 % IJ SOLN
INTRAMUSCULAR | Status: DC | PRN
  Administered 2017-03-21: 8.5 mL
  Administered 2017-03-21: 20 mL

## 2017-03-21 MED ORDER — LIDOCAINE-EPINEPHRINE 1 %-1:100000 IJ SOLN
INTRAMUSCULAR | Status: AC
  Filled 2017-03-21: qty 1

## 2017-03-21 MED ORDER — DOCUSATE SODIUM 100 MG PO CAPS
100.0000 mg | ORAL_CAPSULE | Freq: Two times a day (BID) | ORAL | Status: DC
  Administered 2017-03-21 – 2017-03-22 (×2): 100 mg via ORAL
  Filled 2017-03-21 (×2): qty 1

## 2017-03-21 MED ORDER — MIDAZOLAM HCL 5 MG/5ML IJ SOLN
INTRAMUSCULAR | Status: DC | PRN
  Administered 2017-03-21: 2 mg via INTRAVENOUS

## 2017-03-21 MED ORDER — SUGAMMADEX SODIUM 200 MG/2ML IV SOLN
INTRAVENOUS | Status: DC | PRN
  Administered 2017-03-21: 200 mg via INTRAVENOUS

## 2017-03-21 MED ORDER — SODIUM CHLORIDE 0.9 % IV SOLN: 250.0000 mL | INTRAVENOUS | Status: DC

## 2017-03-21 MED ORDER — ONDANSETRON HCL 4 MG/2ML IJ SOLN: 4.0000 mg | Freq: Four times a day (QID) | INTRAMUSCULAR | Status: DC | PRN

## 2017-03-21 MED ORDER — THROMBIN 20000 UNITS EX SOLR
CUTANEOUS | Status: DC | PRN
  Administered 2017-03-21: 20 mL via TOPICAL

## 2017-03-21 MED ORDER — MIDAZOLAM HCL 2 MG/2ML IJ SOLN
INTRAMUSCULAR | Status: AC
  Filled 2017-03-21: qty 2

## 2017-03-21 MED ORDER — LIDOCAINE-EPINEPHRINE 1 %-1:100000 IJ SOLN
INTRAMUSCULAR | Status: DC | PRN
  Administered 2017-03-21: 8.5 mL

## 2017-03-21 MED ORDER — SODIUM CHLORIDE 0.9% FLUSH: 3.0000 mL | Freq: Two times a day (BID) | INTRAVENOUS | Status: DC

## 2017-03-21 MED ORDER — SUGAMMADEX SODIUM 200 MG/2ML IV SOLN
INTRAVENOUS | Status: AC
  Filled 2017-03-21: qty 2

## 2017-03-21 MED ORDER — POLYETHYLENE GLYCOL 3350 17 G PO PACK: 17.0000 g | PACK | Freq: Every day | ORAL | Status: DC | PRN

## 2017-03-21 MED ORDER — SODIUM CHLORIDE 0.9% FLUSH: 3.0000 mL | INTRAVENOUS | Status: DC | PRN

## 2017-03-21 MED ORDER — HYDROCODONE-ACETAMINOPHEN 5-325 MG PO TABS
1.0000 | ORAL_TABLET | ORAL | Status: DC | PRN
  Administered 2017-03-21 – 2017-03-22 (×4): 2 via ORAL
  Filled 2017-03-21 (×3): qty 2

## 2017-03-21 MED ORDER — PROPOFOL 10 MG/ML IV BOLUS
INTRAVENOUS | Status: AC
  Filled 2017-03-21: qty 20

## 2017-03-21 MED ORDER — METHOCARBAMOL 500 MG PO TABS
500.0000 mg | ORAL_TABLET | Freq: Four times a day (QID) | ORAL | Status: DC | PRN
  Administered 2017-03-21 – 2017-03-22 (×2): 500 mg via ORAL
  Filled 2017-03-21: qty 1

## 2017-03-21 MED ORDER — BUPIVACAINE HCL (PF) 0.5 % IJ SOLN
INTRAMUSCULAR | Status: AC
  Filled 2017-03-21: qty 30

## 2017-03-21 MED ORDER — SENNA 8.6 MG PO TABS
1.0000 | ORAL_TABLET | Freq: Two times a day (BID) | ORAL | Status: DC
  Filled 2017-03-21: qty 1

## 2017-03-21 SURGICAL SUPPLY — 71 items
BAG DECANTER FOR FLEXI CONT (MISCELLANEOUS) ×3 IMPLANT
BASKET BONE COLLECTION (BASKET) ×2 IMPLANT
BLADE CLIPPER SURG (BLADE) IMPLANT
BONE EQUIVA 10CC (Bone Implant) ×3 IMPLANT
BUR MATCHSTICK NEURO 3.0 LAGG (BURR) ×3 IMPLANT
CANISTER SUCT 3000ML PPV (MISCELLANEOUS) ×3 IMPLANT
CARTRIDGE OIL MAESTRO DRILL (MISCELLANEOUS) ×1 IMPLANT
CONT SPEC 4OZ CLIKSEAL STRL BL (MISCELLANEOUS) ×3 IMPLANT
COVER BACK TABLE 24X17X13 BIG (DRAPES) IMPLANT
COVER BACK TABLE 60X90IN (DRAPES) ×3 IMPLANT
DECANTER SPIKE VIAL GLASS SM (MISCELLANEOUS) ×3 IMPLANT
DERMABOND ADVANCED (GAUZE/BANDAGES/DRESSINGS) ×2
DERMABOND ADVANCED .7 DNX12 (GAUZE/BANDAGES/DRESSINGS) ×1 IMPLANT
DEVICE DISSECT PLASMABLAD 3.0S (MISCELLANEOUS) ×1 IMPLANT
DIFFUSER DRILL AIR PNEUMATIC (MISCELLANEOUS) ×3 IMPLANT
DRAPE C-ARM 42X72 X-RAY (DRAPES) ×6 IMPLANT
DRAPE HALF SHEET 40X57 (DRAPES) IMPLANT
DRAPE LAPAROTOMY 100X72X124 (DRAPES) ×3 IMPLANT
DRAPE POUCH INSTRU U-SHP 10X18 (DRAPES) ×3 IMPLANT
DRSG OPSITE POSTOP 4X8 (GAUZE/BANDAGES/DRESSINGS) ×3 IMPLANT
DURAPREP 26ML APPLICATOR (WOUND CARE) ×3 IMPLANT
DURASEAL APPLICATOR TIP (TIP) IMPLANT
DURASEAL SPINE SEALANT 3ML (MISCELLANEOUS) IMPLANT
ELECT REM PT RETURN 9FT ADLT (ELECTROSURGICAL) ×3
ELECTRODE REM PT RTRN 9FT ADLT (ELECTROSURGICAL) ×1 IMPLANT
GAUZE SPONGE 4X4 12PLY STRL (GAUZE/BANDAGES/DRESSINGS) ×3 IMPLANT
GAUZE SPONGE 4X4 16PLY XRAY LF (GAUZE/BANDAGES/DRESSINGS) ×3 IMPLANT
GLOVE BIO SURGEON STRL SZ 6.5 (GLOVE) ×2 IMPLANT
GLOVE BIO SURGEON STRL SZ7 (GLOVE) ×9 IMPLANT
GLOVE BIO SURGEONS STRL SZ 6.5 (GLOVE) ×1
GLOVE BIOGEL PI IND STRL 7.5 (GLOVE) ×2 IMPLANT
GLOVE BIOGEL PI IND STRL 8.5 (GLOVE) ×2 IMPLANT
GLOVE BIOGEL PI INDICATOR 7.5 (GLOVE) ×4
GLOVE BIOGEL PI INDICATOR 8.5 (GLOVE) ×4
GLOVE ECLIPSE 8.5 STRL (GLOVE) ×6 IMPLANT
GOWN STRL REUS W/ TWL LRG LVL3 (GOWN DISPOSABLE) ×4 IMPLANT
GOWN STRL REUS W/ TWL XL LVL3 (GOWN DISPOSABLE) IMPLANT
GOWN STRL REUS W/TWL 2XL LVL3 (GOWN DISPOSABLE) ×6 IMPLANT
GOWN STRL REUS W/TWL LRG LVL3 (GOWN DISPOSABLE) ×8
GOWN STRL REUS W/TWL XL LVL3 (GOWN DISPOSABLE)
HEMOSTAT POWDER KIT SURGIFOAM (HEMOSTASIS) ×3 IMPLANT
KIT BASIN OR (CUSTOM PROCEDURE TRAY) ×3 IMPLANT
KIT ROOM TURNOVER OR (KITS) ×3 IMPLANT
MILL MEDIUM DISP (BLADE) ×3 IMPLANT
NEEDLE HYPO 22GX1.5 SAFETY (NEEDLE) ×3 IMPLANT
NEEDLE SPNL 18GX3.5 QUINCKE PK (NEEDLE) ×3 IMPLANT
NS IRRIG 1000ML POUR BTL (IV SOLUTION) ×3 IMPLANT
OIL CARTRIDGE MAESTRO DRILL (MISCELLANEOUS) ×3
PACK LAMINECTOMY NEURO (CUSTOM PROCEDURE TRAY) ×3 IMPLANT
PAD ARMBOARD 7.5X6 YLW CONV (MISCELLANEOUS) ×9 IMPLANT
PATTIES SURGICAL .5 X1 (DISPOSABLE) ×3 IMPLANT
PLASMABLADE 3.0S (MISCELLANEOUS) ×3
ROD VITALITY CRVD 5.5X75MM (Rod) ×6 IMPLANT
SCREW VITALITY PA 6.5X45MM (Screw) ×6 IMPLANT
SCREW VITALITY PA 6.5X50MM (Screw) ×12 IMPLANT
SPACER ZYSTON STRT 12X25X10X8 (Spacer) ×6 IMPLANT
SPACER ZYSTON STRT 9HX25LX10 (Spacer) ×3 IMPLANT
SPONGE LAP 4X18 X RAY DECT (DISPOSABLE) IMPLANT
SPONGE SURGIFOAM ABS GEL 100 (HEMOSTASIS) ×3 IMPLANT
SUT PROLENE 6 0 BV (SUTURE) IMPLANT
SUT VIC AB 1 CT1 18XBRD ANBCTR (SUTURE) ×2 IMPLANT
SUT VIC AB 1 CT1 8-18 (SUTURE) ×4
SUT VIC AB 2-0 CP2 18 (SUTURE) ×6 IMPLANT
SUT VIC AB 3-0 SH 8-18 (SUTURE) ×3 IMPLANT
SYR 3ML LL SCALE MARK (SYRINGE) ×12 IMPLANT
SYR 5ML LL (SYRINGE) IMPLANT
TOP CLOSURE TORQ LIMIT (Neuro Prosthesis/Implant) ×18 IMPLANT
TOWEL GREEN STERILE (TOWEL DISPOSABLE) ×2 IMPLANT
TOWEL GREEN STERILE FF (TOWEL DISPOSABLE) ×3 IMPLANT
TRAY FOLEY W/METER SILVER 16FR (SET/KITS/TRAYS/PACK) ×3 IMPLANT
WATER STERILE IRR 1000ML POUR (IV SOLUTION) ×3 IMPLANT

## 2017-03-21 NOTE — Transfer of Care (Signed)
Immediate Anesthesia Transfer of Care Note  Patient: Christian LucyJeremy M Joseph  Procedure(s) Performed: Procedure(s): Lumbar four-five Lumbar five sacral one Posterior lumbar interbody fusion with removal of ray cage at Lumbar four-five (N/A)  Patient Location: PACU  Anesthesia Type:General  Level of Consciousness: awake  Airway & Oxygen Therapy: Patient Spontanous Breathing  Post-op Assessment: Report given to RN and Post -op Vital signs reviewed and stable  Post vital signs: Reviewed and stable  Last Vitals:  Vitals:   03/21/17 2137 03/21/17 2138  BP:  (!) 143/86  Pulse:    Resp:    Temp: 36.3 C     Last Pain:  Vitals:   03/21/17 2137  TempSrc:   PainSc: 6       Patients Stated Pain Goal: 4 (03/21/17 1114)  Complications: No apparent anesthesia complications

## 2017-03-21 NOTE — Anesthesia Procedure Notes (Addendum)

## 2017-03-21 NOTE — Anesthesia Postprocedure Evaluation (Signed)
Anesthesia Post Note  Patient: Christian LucyJeremy M Joseph  Procedure(s) Performed: Procedure(s) (LRB): Lumbar four-five Lumbar five sacral one Posterior lumbar interbody fusion with removal of ray cage at Lumbar four-five (N/A)  Patient location during evaluation: PACU Anesthesia Type: General Level of consciousness: awake and alert Pain management: pain level controlled Vital Signs Assessment: post-procedure vital signs reviewed and stable Respiratory status: spontaneous breathing, nonlabored ventilation, respiratory function stable and patient connected to nasal cannula oxygen Cardiovascular status: blood pressure returned to baseline and stable Postop Assessment: no signs of nausea or vomiting Anesthetic complications: no       Last Vitals:  Vitals:   03/21/17 2209 03/21/17 2220  BP: 139/85 131/84  Pulse: 98 75  Resp: 16 17  Temp:  36.9 C    Last Pain:  Vitals:   03/21/17 2220  TempSrc: Oral  PainSc:                  Cecile HearingStephen Edward Archibald Marchetta

## 2017-03-21 NOTE — Op Note (Signed)
Date of surgery: 03/21/2017 Preoperative diagnosis: Pseudoarthrosis L4-L5 with retropulsed cage, chronic radiculopathy, herniated nucleus pulposus L5-S1 with radiculopathy. Postoperative diagnosis: Same Procedure: Decompression of L4-L5 with more work than required for simple interbody technique removal of retropulsed rate cage L4-L5 left. Posterior lumbar interbody arthrodesis with peek spacer local autograft L4-L5.  Laminectomy L5 decompression of L5 and S1 nerve roots excision of disc with more work than required for simple interbody technique. Posterior lumbar interbody arthrodesis L5-S1 with peek spacers local autograft pedicle fixation L4-L5 and the sacrum posterior lateral arthrodesis using allograft L4 to the sacrum. Surgeon: Barnett AbuHenry Arkeem Harts First assistant: Freda JacksonNeelesh Nunudkumar M.D. Indications: Vic BlackbirdJeremy Ales is a 31 year old individual who about 12 years ago had a Ray cage arthrodesis at the L4-L5 level. Soon after this he developed increasing pain and was found to have retropulsion of the cage. 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 After a recent myelogram I discussed with him surgery to remove the retropulsed cage decompress L4 and L5 properly to posterior interbody arthrodesis with the peek spacer at that level and bilateral decompression at L5-S1 with stabilization from L4 to the sacrum. He's taken to the operating room for that procedure now.  Procedure: Patient was brought to the operating room supine on a stretcher after the smooth induction of general endotracheal anesthesia he was turned prone. The back was prepped with alcohol DuraPrep and draped in a sterile fashion. An elliptical incision was made around this  previous scar and this was excised. Dissection was taken down to the lumbar dorsal fascia were the spinous processes of L4 and L5 and the sacrum were uncovered. 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 This was ultimately resected totally. The dura was then carefully dissected and dense adhesions were released particularly off to the left side were careful dissection yielded the Ray cage. This was then dissected away medially and the face of the cage could be a completely exposed. The lid on the cage was removed and hearty-appearing bone inside the cage was encountered. Cage however was loose within the interspace the bone was removed for the first third of the cage until the tangs inside the cage could be exposed to allow placement of the cage implant tool. This was placed into the cage and then the cage was withdrawn by simply unscrewing it. Care was taken to protect the dura successfully. Once the cage was removed the interspace could be explored and was noted to be a substantial quantity of significant disc material that remained in the disc space. [place] was dissected free and a total discectomy was performed all the way over to the opposite cage on the right side. The endplates were then rongeured carefully and the disc shaped so as to allow placement of a peek spacers that measured 8 mm in height with 4 of lordosis and 25 mm in length. This was then placed along with autograft from the laminectomies into the interspace total of 4-1/2 mL of autograft was placed into the interspace on the left side at L4-5. Attention was then turned to L5-S1 after completion of the laminectomy of L5 the common dural tube and the S1 nerve roots  were well explored and carefully decompressed. On the left side years noted to be a substantial subligamentous disc herniation of the L5-S1 disc space. This disc was excised and the disc space was entered a segment significant quantity of very modestly  degenerated disc was encountered and removal of this disc was then undertaken bilaterally so as to decompress the disc space totally. The interspace was then size is felt that an 8 lordotic 12 mm tall spacer would fit best into the interspace this was filled with cancellus bone and a total of 12 mL of cancellus bone was fit into the interspace along with 2 cages. Lateral gutters were then decorticated and packed for later use in grafting. Pedicle entry sites were chosen at L4-L5 and the sacrum. 6.5 x 50 mm screws were placed in L4 and L5 0.5 x 45 mm screws were placed in the sacrum. A 75 mm precontoured rod connected the screws from L4 to sacrum. Lateral gutters which had been previously decorticated were then filled with a total of 15 mL of autograft and allograft in the form of he quit the bone. Hemostasis was then checked and final radiographs were obtained demonstrating good fixation in normal lordotic curve with this the wound was closed with 1 Vicryl in the lumbar dorsal fascia 2-0 Vicryl subcutaneous anus tissues and 3-0 Vicryl subcuticularly. Blood loss is estimated 600 mL 200 mL of Cell Saver blood was returned to the patient.

## 2017-03-21 NOTE — Anesthesia Preprocedure Evaluation (Signed)
Anesthesia Evaluation  Patient identified by MRN, date of birth, ID band Patient awake    Reviewed: Allergy & Precautions, H&P , Patient's Chart, lab work & pertinent test results, reviewed documented beta blocker date and time   Airway Mallampati: II  TM Distance: >3 FB Neck ROM: full    Dental no notable dental hx.    Pulmonary    Pulmonary exam normal breath sounds clear to auscultation       Cardiovascular  Rhythm:regular Rate:Normal     Neuro/Psych    GI/Hepatic   Endo/Other    Renal/GU      Musculoskeletal   Abdominal   Peds  Hematology   Anesthesia Other Findings   Reproductive/Obstetrics                             Anesthesia Physical Anesthesia Plan  ASA: II  Anesthesia Plan: General   Post-op Pain Management:    Induction: Intravenous  Airway Management Planned: Oral ETT  Additional Equipment:   Intra-op Plan:   Post-operative Plan: Extubation in OR  Informed Consent: I have reviewed the patients History and Physical, chart, labs and discussed the procedure including the risks, benefits and alternatives for the proposed anesthesia with the patient or authorized representative who has indicated his/her understanding and acceptance.   Dental Advisory Given  Plan Discussed with: CRNA and Surgeon  Anesthesia Plan Comments: (  )        Anesthesia Quick Evaluation  

## 2017-03-21 NOTE — H&P (Signed)
Christian Joseph is an 31 y.o. male.   Chief complaint: Back and left lower extremity pain HPI: Patient is a 31 year old individual who number of years ago it undergone a Ray cage arthrodesis at L4-L5 by Dr. Channing Muttersoy patient had developed recurrent pain subsequently and was noted to have retropulsion of his Ray cage into the left side of the canal is treated conservatively and improved gradually however each Chronic back pain and left-sided buttock pain which he notes now is getting worse plain radiographs demonstrate that he has a pseudoarthrosis at the level of L4-L5 and he has lateral recess stenosis secondary to hypertrophy of the facet joint in addition to the retropulsed bone after careful consideration is been advised regarding surgery to revise his lumbar spine is now taken to the operating room for the procedure.  Past Medical History:  Diagnosis Date  . Bradycardia   . Paresthesia 12/07/2015  . Ulcerative colitis     Past Surgical History:  Procedure Laterality Date  . BACK SURGERY    . COLOPROCTECTOMY W/ ILEO J POUCH      History reviewed. No pertinent family history. Social History:  reports that he has never smoked. His smokeless tobacco use includes Snuff. He reports that he drinks alcohol. He reports that he does not use drugs.  Allergies: No Known Allergies  Medications Prior to Admission  Medication Sig Dispense Refill  . HYDROcodone-acetaminophen (NORCO/VICODIN) 5-325 MG tablet Take 1 tablet by mouth every 6 (six) hours as needed for moderate pain.    Marland Kitchen. ibuprofen (ADVIL,MOTRIN) 200 MG tablet Take 800 mg by mouth every 8 (eight) hours as needed for mild pain or moderate pain.      No results found for this or any previous visit (from the past 48 hour(s)). No results found.  Review of Systems  HENT: Negative.   Eyes: Negative.   Cardiovascular: Negative.   Gastrointestinal: Negative.   Genitourinary: Negative.   Musculoskeletal: Positive for back pain.  Skin: Negative.    Neurological: Positive for focal weakness and weakness.  Psychiatric/Behavioral: Negative.     Blood pressure 112/64, pulse (!) 45, temperature 98.3 F (36.8 C), temperature source Oral, resp. rate 20, height 6\' 1"  (1.854 m), weight 94.3 kg (207 lb 12.8 oz), SpO2 100 %. Physical Exam  Constitutional: He is oriented to person, place, and time. He appears well-developed.  HENT:  Head: Normocephalic and atraumatic.  Eyes: EOM are normal. Pupils are equal, round, and reactive to light.  Neck: Normal range of motion. Neck supple.  Cardiovascular: Regular rhythm.   Respiratory: Effort normal and breath sounds normal.  GI: Bowel sounds are normal.  Neurological: He is alert and oriented to person, place, and time.  Weakness in the tibialis anterior on the left rated 4 out of 5 weakness in the gastroc on the left also 4 out of 5 absent deep tendon reflexes in the patellae and the Achilles  Skin: Skin is warm and dry.  Psychiatric: He has a normal mood and affect. His behavior is normal. Judgment and thought content normal.     Assessment/Plan Pseudoarthrosis L4-L5 with retropulsed cage and stenosis with lumbar radiculopathy on left side L5.  Plan: Decompression of L4-L5 with revision of pseudoarthrosis L4-L5.  Stefani DamaELSNER,Tora Prunty J, MD 03/21/2017, 3:59 PM

## 2017-03-22 LAB — CBC
HCT: 41.3 % (ref 39.0–52.0)
HEMOGLOBIN: 14.3 g/dL (ref 13.0–17.0)
MCH: 30.2 pg (ref 26.0–34.0)
MCHC: 34.6 g/dL (ref 30.0–36.0)
MCV: 87.3 fL (ref 78.0–100.0)
Platelets: 294 10*3/uL (ref 150–400)
RBC: 4.73 MIL/uL (ref 4.22–5.81)
RDW: 12.5 % (ref 11.5–15.5)
WBC: 14.5 10*3/uL — AB (ref 4.0–10.5)

## 2017-03-22 LAB — BASIC METABOLIC PANEL
Anion gap: 7 (ref 5–15)
BUN: 13 mg/dL (ref 6–20)
CALCIUM: 8.5 mg/dL — AB (ref 8.9–10.3)
CHLORIDE: 101 mmol/L (ref 101–111)
CO2: 28 mmol/L (ref 22–32)
CREATININE: 1.47 mg/dL — AB (ref 0.61–1.24)
GFR calc non Af Amer: >60 mL/min (ref >60)
Glucose, Bld: 121 mg/dL — ABNORMAL HIGH (ref 65–99)
Potassium: 3.9 mmol/L (ref 3.5–5.1)
SODIUM: 136 mmol/L (ref 135–145)

## 2017-03-22 MED ORDER — METHOCARBAMOL 500 MG PO TABS: 500.0000 mg | ORAL_TABLET | Freq: Four times a day (QID) | ORAL | 3 refills | Status: DC | PRN

## 2017-03-22 MED ORDER — HYDROCODONE-ACETAMINOPHEN 5-325 MG PO TABS: 1.0000 | ORAL_TABLET | ORAL | 0 refills | Status: DC | PRN

## 2017-03-22 MED FILL — Sodium Chloride IV Soln 0.9%: INTRAVENOUS | Qty: 1000 | Status: AC

## 2017-03-22 MED FILL — Heparin Sodium (Porcine) Inj 1000 Unit/ML: INTRAMUSCULAR | Qty: 30 | Status: AC

## 2017-03-22 NOTE — Evaluation (Addendum)
Physical Therapy Evaluation and Discharge Patient Details Name: Christian Joseph MRN: [id number] DOB: Oct 28, 1986 Today's Date: 03/22/2017   History of Present Illness  Pt is a 31 y/o male who is s/p L4-5 L5-S1 PLIF with removal of ray cage (inserted ~12 years ago) at L4-5. Pt has a past medical history of Bradycardia; Paresthesia (12/07/2015); Ulcerative colitis; and Coloproctectomy w/ ileo J-pouch.  Clinical Impression  Patient evaluated by Physical Therapy with no further acute PT needs identified. All education has been completed and the patient has no further questions. At the time of PT eval pt was able to perform transfers and ambulation with gross modified independence. Pt was educated on precautions, brace application and wearing schedule, walking program, car transfer and general safety at home with progressing activity. See below for any follow-up Physical Therapy or equipment needs. PT is signing off. Thank you for this referral.     Follow Up Recommendations No PT follow up;Supervision - Intermittent    Equipment Recommendations  None recommended by PT    Recommendations for Other Services       Precautions / Restrictions Precautions Precautions: Back Precaution Booklet Issued: Yes (comment) Precaution Comments: Handout reviewed in detail and pt was cued for precautions during functional mobility.  Required Braces or Orthoses: Spinal Brace Spinal Brace: Lumbar corset;Applied in sitting position Restrictions Weight Bearing Restrictions: No      Mobility  Bed Mobility Overal bed mobility: Modified Independent             General bed mobility comments: VC's for proper log roll technique. Able to complete without assist and good form.   Transfers Overall transfer level: Modified independent Equipment used: None             General transfer comment: VC's to minimize forward lean with power-up to stand.   Ambulation/Gait Ambulation/Gait assistance: Modified  independent (Device/Increase time) Ambulation Distance (Feet): 400 Feet Assistive device: None Gait Pattern/deviations: WFL(Within Functional Limits)   Gait velocity interpretation: at or above normal speed for age/gender    Stairs Stairs: Yes Stairs assistance: Modified independent (Device/Increase time) Stair Management: One rail Right;Alternating pattern;Forwards Number of Stairs: 10    Wheelchair Mobility    Modified Rankin (Stroke Patients Only)       Balance Overall balance assessment: No apparent balance deficits (not formally assessed)                                           Pertinent Vitals/Pain Pain Assessment: Faces Faces Pain Scale: Hurts a little bit Pain Location: Incision site Pain Descriptors / Indicators: Operative site guarding Pain Intervention(s): Limited activity within patient's tolerance;Monitored during session;Repositioned    Home Living Family/patient expects to be discharged to:: Private residence Living Arrangements: Spouse/significant other Available Help at Discharge: Family;Available 24 hours/day Type of Home: House Home Access: Stairs to enter   [employer] of Steps: 4 Home Layout: One level Home Equipment: Bedside commode      Prior Function Level of Independence: Independent         Comments: Works as a Paramedic        Extremity/Trunk Assessment   Upper Extremity Assessment Upper Extremity Assessment: Overall WFL for tasks assessed    Lower Extremity Assessment Lower Extremity Assessment: Overall WFL for tasks assessed    Cervical / Trunk Assessment Cervical / Trunk Assessment: Other exceptions  Cervical / Trunk Exceptions: Forward head/rounded shoulders. s/p surgery  Communication   Communication: No difficulties  Cognition Arousal/Alertness: Awake/alert Behavior During Therapy: WFL for tasks assessed/performed Overall Cognitive Status: Within Functional  Limits for tasks assessed                                        General Comments      Exercises     Assessment/Plan    PT Assessment Patent does not need any further PT services  PT Problem List         PT Treatment Interventions      PT Goals (Current goals can be found in the Care Plan section)  Acute Rehab PT Goals PT Goal Formulation: All assessment and education complete, DC therapy    Frequency     Barriers to discharge        Co-evaluation               AM-PAC PT "6 Clicks" Daily Activity  Outcome Measure Difficulty turning over in bed (including adjusting bedclothes, sheets and blankets)?: None Difficulty moving from lying on back to sitting on the side of the bed? : None Difficulty sitting down on and standing up from a chair with arms (e.g., wheelchair, bedside commode, etc,.)?: None Help needed moving to and from a bed to chair (including a wheelchair)?: None Help needed walking in hospital room?: None Help needed climbing 3-5 steps with a railing? : None 6 Click Score: 24    End of Session Equipment Utilized During Treatment: Back brace Activity Tolerance: Patient tolerated treatment well Patient left: in chair;with call bell/phone within reach;with family/visitor present Nurse Communication: Mobility status PT Visit Diagnosis: Pain Pain - part of body:  (Back)    Time: [id number] PT Time Calculation (min) (ACUTE ONLY): 22 min   Charges:   PT Evaluation $PT Eval Low Complexity: 1 Procedure     PT G Codes:        Christian SlipperLaura Baudelio Joseph, PT, DPT Acute Rehabilitation Services Pager: [phone]   Christian PearsonLaura D Keian Joseph 03/22/2017, 9:08 AM

## 2017-03-22 NOTE — Evaluation (Signed)
Occupational Therapy Evaluation and Discharge Patient Details Name: Christian LucyJeremy M Kleiner MRN: [id number] DOB: 1986-05-08 Today's Date: 03/22/2017    History of Present Illness Pt is a 31 y/o male who is s/p L4-5 L5-S1 PLIF with removal of ray cage (inserted ~12 years ago) at L4-5. Pt has a past medical history of Bradycardia; Paresthesia (12/07/2015); Ulcerative colitis; and Coloproctectomy w/ ileo J-pouch.   Clinical Impression   PTA pt independent in ADL/IADL and mobility. Works as Administratorlandscaper, driving, hunting, fishing. Back handout provided and reviewed adls in detail. Pt educated on: clothing between brace, never sleep in brace, set an alarm at night for medication, avoid sitting for long periods of time, correct bed positioning for sleeping, correct sequence for bed mobility, avoiding lifting more than 5 pounds and never wash directly over incision. All education is complete and patient and wife indicate understanding. No further OT needs at this time. OT to sign off.      Follow Up Recommendations  No OT follow up    Equipment Recommendations  None recommended by OT    Recommendations for Other Services       Precautions / Restrictions Precautions Precautions: Back Precaution Booklet Issued: Yes (comment) Precaution Comments: Handout reviewed in detail and pt was cued for precautions during functional mobility.  Required Braces or Orthoses: Spinal Brace Spinal Brace: Lumbar corset;Applied in sitting position Restrictions Weight Bearing Restrictions: No      Mobility Bed Mobility Overal bed mobility: Modified Independent             General bed mobility comments: good carryover for proper log roll technique from earlier PT session. Able to complete without assist and good form.   Transfers Overall transfer level: Modified independent Equipment used: None             General transfer comment: VC's to minimize forward lean with power-up to stand.     Balance  Overall balance assessment: No apparent balance deficits (not formally assessed)                                         ADL either performed or assessed with clinical judgement   ADL Overall ADL's : Needs assistance/impaired Eating/Feeding: Modified independent;Sitting Eating/Feeding Details (indicate cue type and reason): sitting EOB Grooming: Supervision/safety;Standing Grooming Details (indicate cue type and reason): educated in cup method and setting up on right for precautions Upper Body Bathing: Supervision/ safety;Standing;With adaptive equipment Upper Body Bathing Details (indicate cue type and reason): educated on long handle sponge Lower Body Bathing: Supervison/ safety;With adaptive equipment   Upper Body Dressing : Supervision/safety;Sitting Upper Body Dressing Details (indicate cue type and reason): to don brace Lower Body Dressing: Supervision/safety;Sit to/from stand Lower Body Dressing Details (indicate cue type and reason): able to bring feet to knees, wife also available for assist Toilet Transfer: Supervision/safety   Toileting- Clothing Manipulation and Hygiene: Supervision/safety Toileting - Clothing Manipulation Details (indicate cue type and reason): discussed toileting aide for rear peri care, and use of flushable wet wipes Tub/ Shower Transfer: Tub transfer;Supervision/safety;Ambulation Tub/Shower Transfer Details (indicate cue type and reason): demonstrated use of wall for extra stability and education for use on non-slip mat Functional mobility during ADLs: Supervision/safety General ADL Comments: Pt and wife very pleasant, reviewed grabber/reacher and long handle sponge as well as safety and compensatory strategies in full     Vision Patient Visual Report: No change from  baseline Vision Assessment?: No apparent visual deficits     Perception     Praxis      Pertinent Vitals/Pain Pain Assessment: Faces Faces Pain Scale: Hurts little  more Pain Location: Incision site Pain Descriptors / Indicators: Operative site guarding Pain Intervention(s): Limited activity within patient's tolerance;Monitored during session;Repositioned     Hand Dominance Right   Extremity/Trunk Assessment Upper Extremity Assessment Upper Extremity Assessment: Overall WFL for tasks assessed   Lower Extremity Assessment Lower Extremity Assessment: Overall WFL for tasks assessed   Cervical / Trunk Assessment Cervical / Trunk Assessment: Other exceptions Cervical / Trunk Exceptions: Forward head/rounded shoulders. s/p surgery   Communication Communication Communication: No difficulties   Cognition Arousal/Alertness: Awake/alert Behavior During Therapy: WFL for tasks assessed/performed Overall Cognitive Status: Within Functional Limits for tasks assessed                                     General Comments  wife present for session    Exercises     Shoulder Instructions      Home Living Family/patient expects to be discharged to:: Private residence Living Arrangements: Spouse/significant other Available Help at Discharge: Family;Available 24 hours/day Type of Home: House Home Access: Stairs to enter [employer] of Steps: 4   Home Layout: One level     Bathroom Shower/Tub: Chief Strategy Officer: Standard Bathroom Accessibility: Yes   Home Equipment: Bedside commode          Prior Functioning/Environment Level of Independence: Independent        Comments: Works as a Environmental consultant List: Decreased range of motion;Decreased knowledge of use of DME or AE;Decreased knowledge of precautions;Pain      OT Treatment/Interventions:      OT Goals(Current goals can be found in the care plan section) Acute Rehab OT Goals Patient Stated Goal: to get back to fishing OT Goal Formulation: With patient/family Time For Goal Achievement: 04/05/17 Potential to Achieve Goals:  Good  OT Frequency:     Barriers to D/C:            Co-evaluation              AM-PAC PT "6 Clicks" Daily Activity     Outcome Measure Help from another person eating meals?: None Help from another person taking care of personal grooming?: None Help from another person toileting, which includes using toliet, bedpan, or urinal?: None Help from another person bathing (including washing, rinsing, drying)?: None Help from another person to put on and taking off regular upper body clothing?: None Help from another person to put on and taking off regular lower body clothing?: None 6 Click Score: 24   End of Session Equipment Utilized During Treatment: Back brace Nurse Communication: Mobility status;Precautions  Activity Tolerance: Patient tolerated treatment well Patient left: in bed;with call bell/phone within reach;with family/visitor present (sitting EOB)  OT Visit Diagnosis: Pain Pain - Right/Left: Left Pain - part of body: Leg (back)                Time: 9604-5409 OT Time Calculation (min): 16 min Charges:  OT General Charges $OT Visit: 1 Procedure OT Evaluation $OT Eval Low Complexity: 1 Procedure G-Codes:     Sherryl Manges OTR/L [phone]   Christian Joseph 03/22/2017, 9:53 AM

## 2017-03-22 NOTE — Discharge Summary (Signed)
Physician Discharge Summary  Patient ID: Christian Joseph MRN: [id number] DOB/AGE: Feb 21, 1986 31 y.o.  Admit date: 03/21/2017 Discharge date: 03/22/2017  Admission Diagnoses:Pseudoarthrosis L4-L5 with radiculopathy, herniated nucleus pulposus L5-S1 with radiculopathy, chronic back pain  Discharge Diagnoses: Pseudoarthrosis L4-L5 with radiculopathy, herniated nucleus pulposus L5-S1 left with radiculopathy, chronic back pain Active Problems:   Lumbar stenosis   Discharged Condition: good  Hospital Course: Patient was admitted to undergo surgical revision of L4-L5 with removal of a Ray cage that was placed 12 years ago. He also underwent discectomy at L5-S1 with decompression of the L5 and the S1 nerve roots. He tolerated surgery well is ambulatory his voiding pain is under reasonable control with oral pain medication.  Consults: None none  Significant Diagnostic Studies: None  Treatments: surgery: Revision of arthrodesis L4-L5 with removal of right cage, placement of posterior lumbar interbody arthrodesis L4-5 L5-S1 decompression of the L5 and S1 nerve roots on the left side. Segmental fixation L4 to sacrum. Posterior lateral arthrodesis with local autograft and allograft L4 to sacrum.  Discharge Exam: Blood pressure 105/85, pulse 85, temperature 98.9 F (37.2 C), temperature source Oral, resp. rate 20, height 6\' 1"  (1.854 m), weight 94.3 kg (207 lb 12.8 oz), SpO2 98 %. Incision/Wound: Incision is clean and dry. Station and gait are intact  Disposition:   Discharge Instructions    Call MD for:  redness, tenderness, or signs of infection (pain, swelling, redness, odor or green/yellow discharge around incision site)    Complete by:  As directed    Call MD for:  severe uncontrolled pain    Complete by:  As directed    Call MD for:  temperature >100.4    Complete by:  As directed    Diet - low sodium heart healthy    Complete by:  As directed    Incentive spirometry RT    Complete by:   As directed    Increase activity slowly    Complete by:  As directed      Allergies as of 03/22/2017   No Known Allergies     Medication List    TAKE these medications   HYDROcodone-acetaminophen 5-325 MG tablet Commonly known as:  NORCO/VICODIN Take 1 tablet by mouth every 6 (six) hours as needed for moderate pain. What changed:  Another medication with the same name was added. Make sure you understand how and when to take each.   HYDROcodone-acetaminophen 5-325 MG tablet Commonly known as:  NORCO/VICODIN Take 1-2 tablets by mouth every 4 (four) hours as needed (breakthrough pain). What changed:  You were already taking a medication with the same name, and this prescription was added. Make sure you understand how and when to take each.   ibuprofen 200 MG tablet Commonly known as:  ADVIL,MOTRIN Take 800 mg by mouth every 8 (eight) hours as needed for mild pain or moderate pain.   methocarbamol 500 MG tablet Commonly known as:  ROBAXIN Take 1 tablet (500 mg total) by mouth every 6 (six) hours as needed for muscle spasms.        SignedStefani Dama: Christian Joseph 03/22/2017, 9:23 AM

## 2017-03-22 NOTE — Progress Notes (Signed)
Patient ID: Christian LucyJeremy M Schiller, male   DOB: [birth year], 31 y.o.   MRN: [id number] Vital signs are stable Motor function is intact Doing well We'll discharge later this afternoon

## 2017-03-22 NOTE — Progress Notes (Signed)
Patient alert and oriented, mae's well, voiding adequate amount of urine, swallowing without difficulty, no c/o pain. Patient discharged home with family. Script and discharged instructions given to patient. Patient and family stated understanding of d/c instructions given and has an appointment with MD. 

## 2017-03-29 ENCOUNTER — Encounter (HOSPITAL_COMMUNITY): Payer: Self-pay | Admitting: Neurological Surgery

## 2018-08-03 IMAGING — CT CT L SPINE W/ CM
3 of 17 series · 8 of 33 positions shown, 10 images · non-contrast
Comparison: CT lumbar myelogram 03/09/2011.

CLINICAL DATA: 30-year-old male with intermittent lumbar back pain
radiating down the left leg for 2 years. Prior back surgery at age
15.

EXAM:
LUMBAR MYELOGRAM
FLUOROSCOPY TIME:  0 minutes 42 seconds
PROCEDURE:
Lumbar puncture and intrathecal contrast administration were
performed by Dr. CHEN WAUGH who will separately report for the
portion of the procedure. I personally supervised acquisition of the
myelogram images.
TECHNIQUE: Contiguous axial images were obtained through the Lumbar spine after
the intrathecal infusion of infusion. Coronal and sagittal
reconstructions were obtained of the axial image sets.

[Series 204: soft tissue, idose (2) · axial · 0.39mm/px · z∈[+312,+416]mm · 2 of 157 slices shown, 3 images]
[im 53/157  soft-tissue]
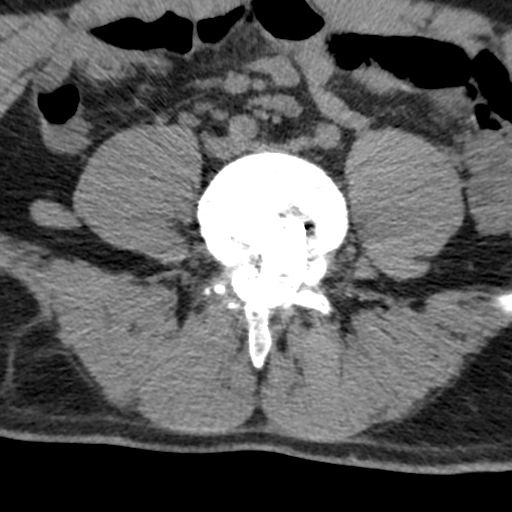
[im 53/157  bone]
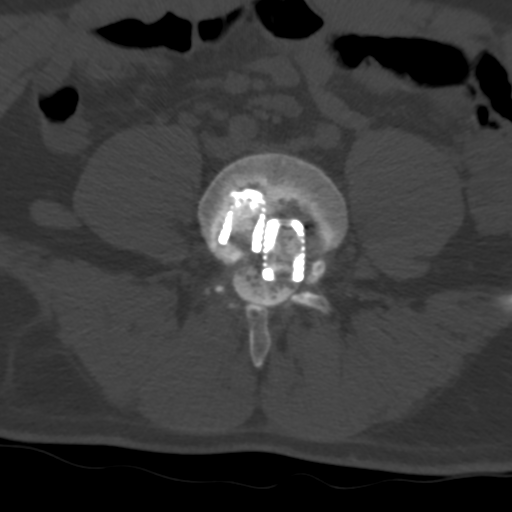
[im 105/157  bone]
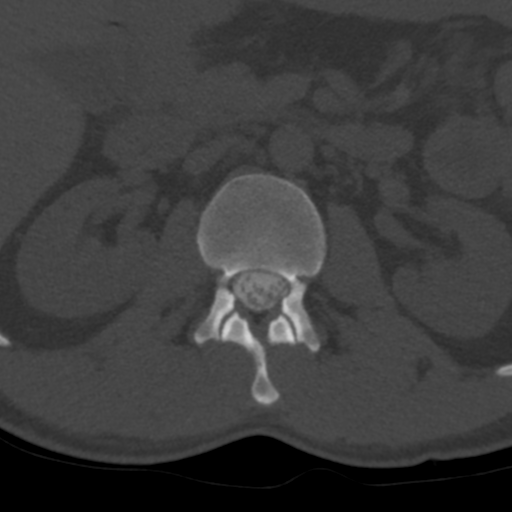

[Series 208: o-mar, coronal bone, idose (2) · coronal · 0.47mm/px · 1 of 99 slices shown]
[im 88/99  bone]
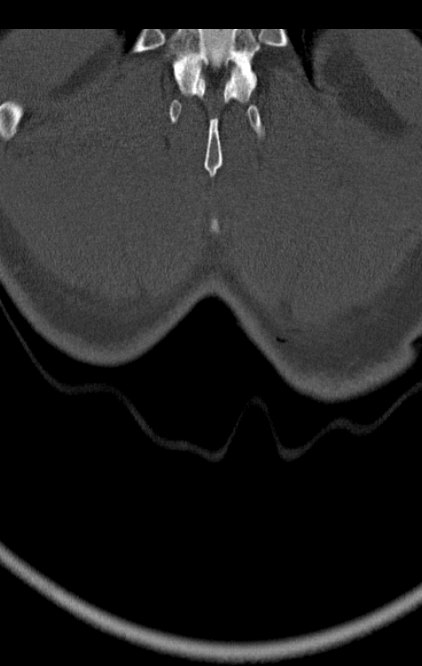

[Series 214: sagittal st, idose (2) · sagittal · 0.45mm/px · 5 of 99 slices shown, 6 images]
[im 33/99  bone]
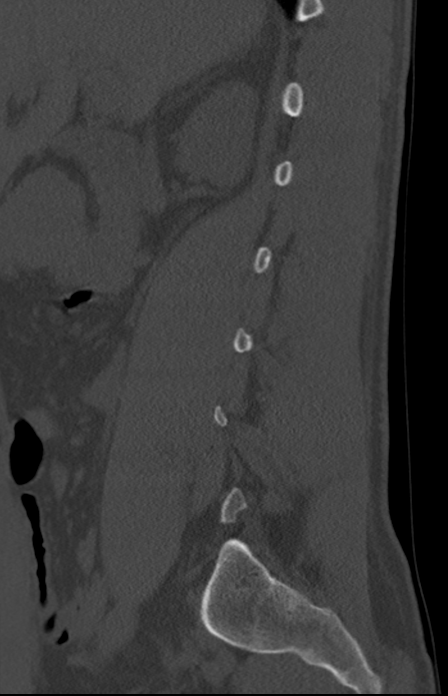
[im 41/99  bone]
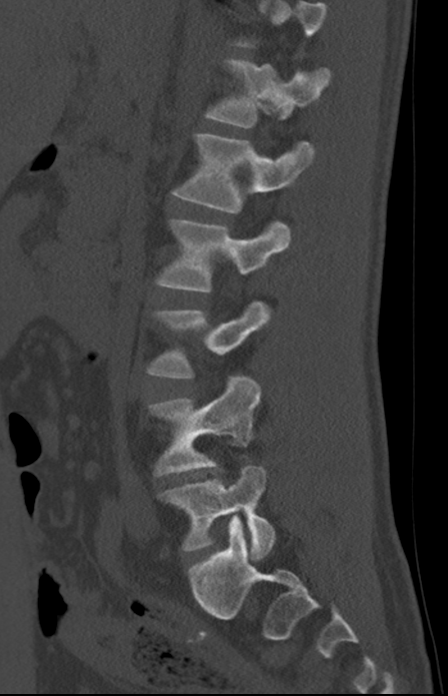
[im 50/99  soft-tissue]
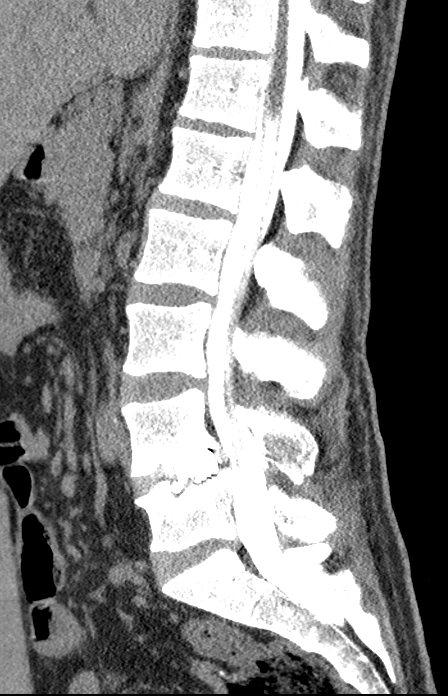
[im 50/99  bone]
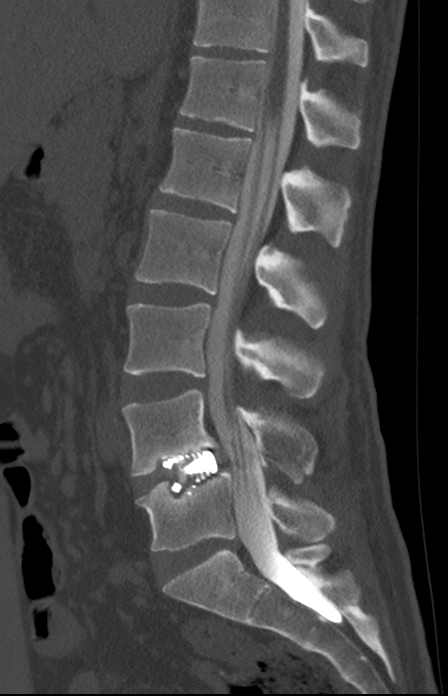
[im 58/99  bone]
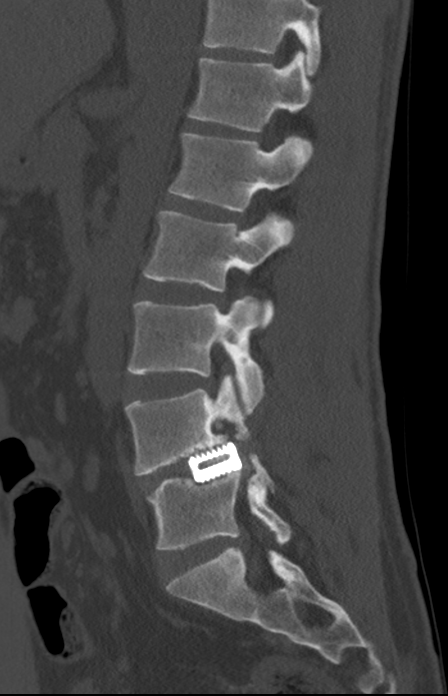
[im 66/99  bone]
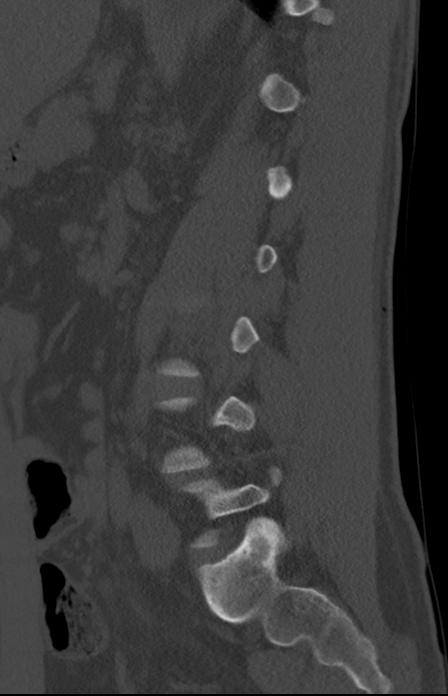

[8 of 33 positions shown; findings below may reference images not displayed]

FINDINGS: LUMBAR MYELOGRAM FINDINGS:

Normal lumbar segmentation, the same numbering system as on the 0860
comparison. Stable lumbar vertebral height and alignment since 0860.
Mild chronic anterolisthesis of L4 on L5 with previous ray cage
interbody fusion at that level. Chronic retropulsion of the left ray
cage appears stable.

Upright lateral views in neutral flexion and extension positioning
were performed. On the upright views mild retrolisthesis of L3 on L4
becomes apparent. There is no motion at L4-L5 between neutral and
extension. There is no convincing motion at L4-L5 between neutral
and flexion. There is mildly disproportionate motion at L3-L4
between neutral and flexion.

CT LUMBAR MYELOGRAM FINDINGS:

No acute osseous abnormality identified. Visible sacrum and SI
joints are intact.

Chronic postoperative changes at the rectosigmoid junction with a
partially visible anastomosis. No adverse features. Mild
postoperative changes to small bowel in the right gutter. Other
noncontrast abdominal viscera appear normal. Negative visualized
posterior paraspinal soft tissues.

Normal myelographic appearance of the lower thoracic spinal cord
with conus medullaris at T12-L1.

T11-T12:  Negative.

T12-L1:  Negative.

L1-L2:  Negative.

L2-L3: Minimal circumferential disc bulge. Mild facet and ligament
flavum hypertrophy. No significant stenosis. This level is stable.

L3-L4: Subtle retrolisthesis of L3 on L4 appears stable to the 0860
CT. Mild circumferential disc bulge with broad-based posterior
component. Mild to moderate facet and ligament flavum hypertrophy.
No significant stenosis. This level is stable.

L4-L5: Sequelae of interbody fusion with ray cages. There does
appear to be some bone in growth into the superior aspect of both
cages, however, there is no convincing arthrodesis. There is stable
chronic retropulsion of the left ray cage, and stable chronic
endplate subsidence about the right ray cage. Sequelae of posterior
decompression. Stable chronic mass effect on the left thecal sac and
left lateral recess (series 201, image 105). Chronic left greater
than right foraminal endplate spurring. Stable chronic mild osseous
left L4 foraminal stenosis.

L5-S1: Mild retrolisthesis of L5 on S1 appears stable from the 0860
CT. Mild to moderate facet hypertrophy is stable. However, there is
evidence of new broad-based left lateral recess level disc bulging
or herniation, and there is new effacement of contrast from around
the descending left S1 nerve roots in the lateral recess since 0860
(series 201, image 123 today versus series 2, image 82 in 0860). The
right lateral recess remains normal. No spinal stenosis. No
significant foraminal stenosis.
IMPRESSION: LUMBAR MYELOGRAM IMPRESSION:

1. Stable configuration of chronic L4-L5 ray cage interbody
implants. No definite abnormal motion at L4-L5 despite absence of
arthrodesis at that level (see CT impression below).
2. Mild retrolisthesis of L3 on L4 and mildly disproportionate
motion at that level in flexion.

CT LUMBAR MYELOGRAM IMPRESSION:

1. Absent arthrodesis at L4-L5 with stable appearance of chronically
retropulsed left ray cage and chronic subsidence of the right ray
cage. Stable left lateral recess and left foraminal stenosis since
the 0860 CT myelogram.
2. Stable mild retrolisthesis of L5 on S1 but new disc herniation
into the left lateral recess since the 0860 myelogram. Query left S1
radiculitis.
3. Adjacent segment disease with disc bulging and posterior element
hypertrophy at L3-L4 appears stable since 0860 with no significant
stenosis.

## 2018-09-06 IMAGING — RF DG LUMBAR SPINE 2-3V
1 series · 3 of 3 positions shown · non-contrast
Comparison: Preoperative radiographs 02/01/2017

CLINICAL DATA: PLIF L4-L5 and L5-S1.

EXAM:
LUMBAR SPINE - 2-3 VIEW; DG C-ARM 61-120 MIN

[Series 1: run · 3 of 3 slices shown]
[im 1/3]
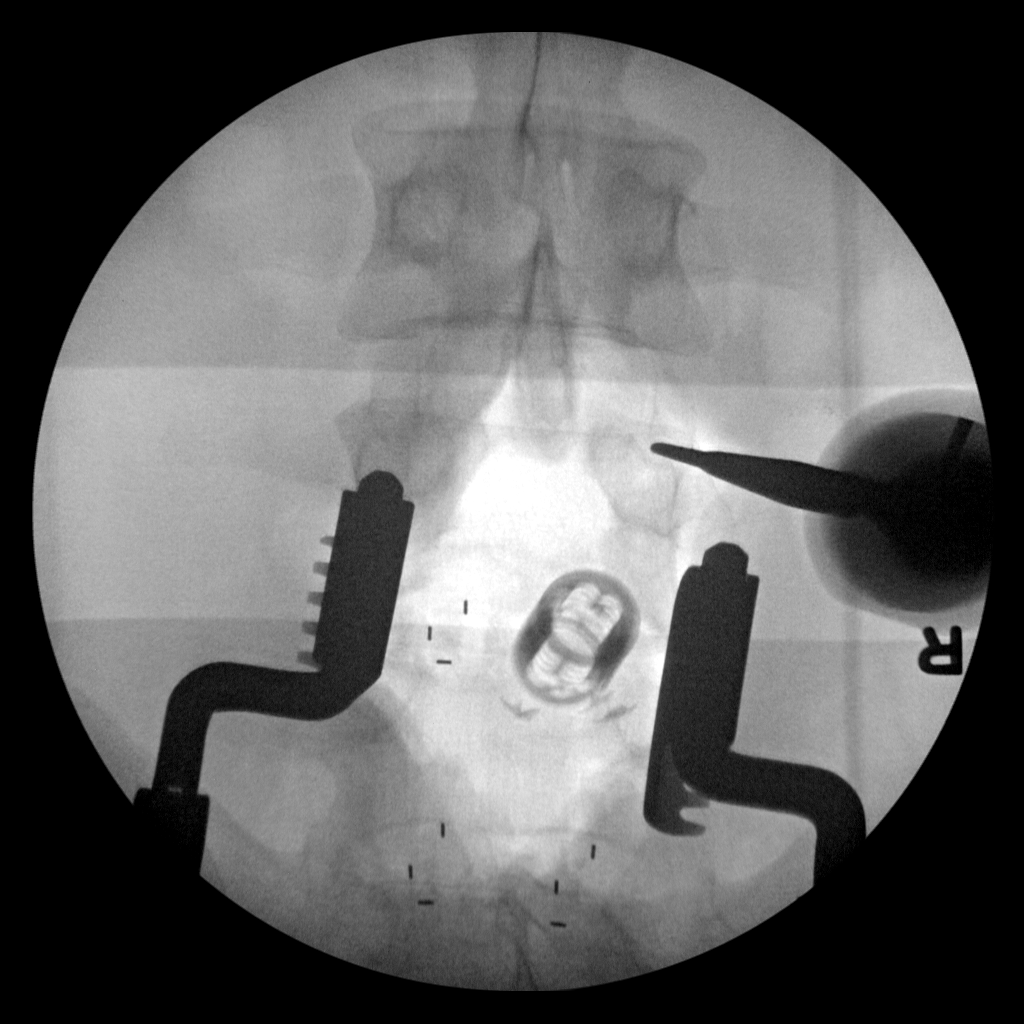
[im 2/3]
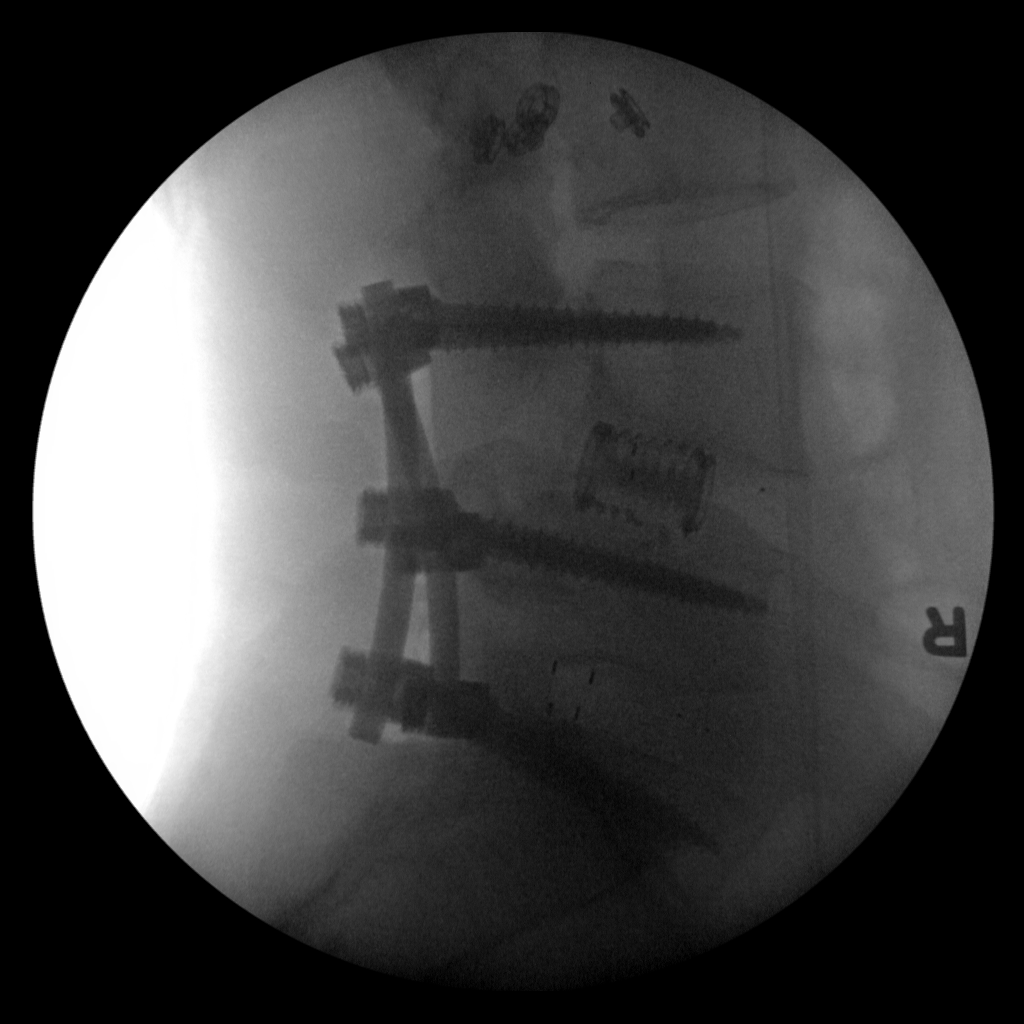
[im 3/3]
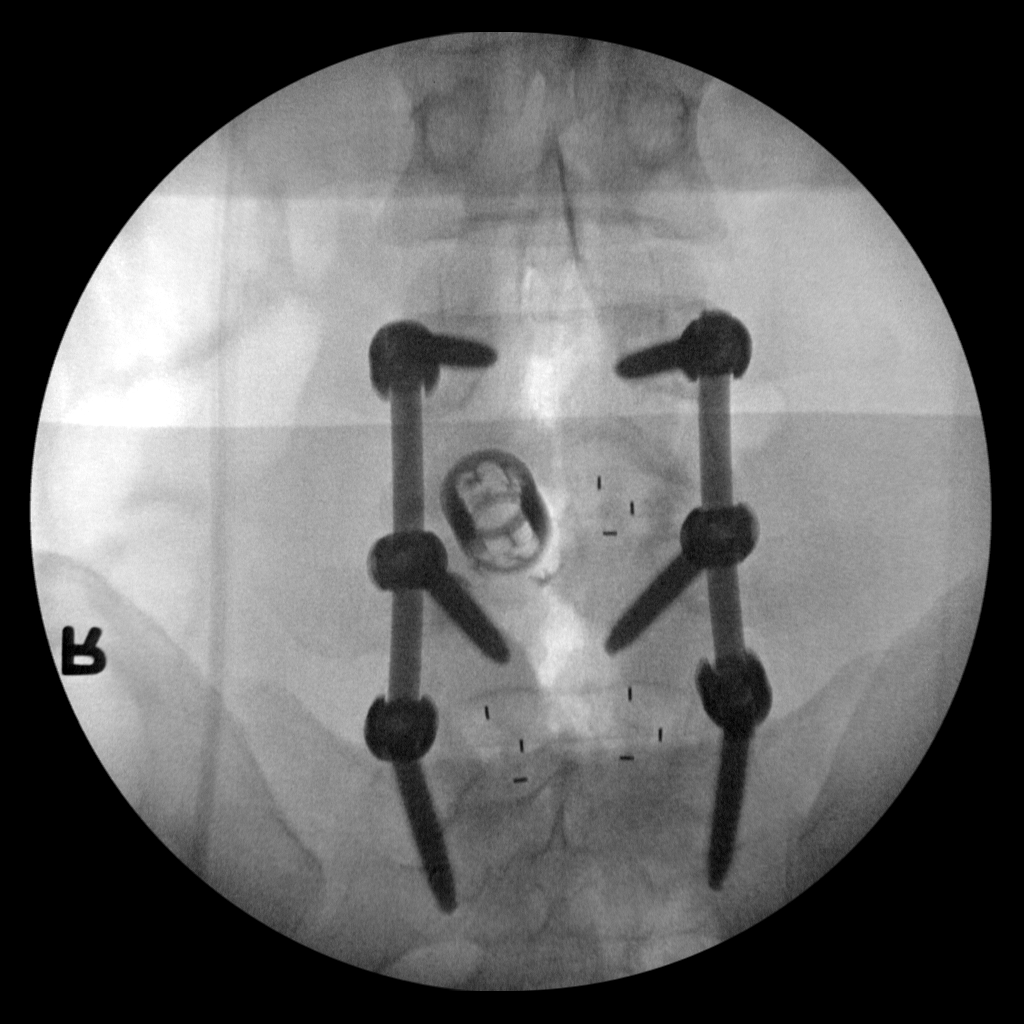

[3 of 3 positions shown; findings below may reference images not displayed]

FINDINGS: Posterior rod and intrapedicular screw fusion L4 through S1.
Interbody spacer at L5-S1 is new. Revised interbody spacer at L4-L5.
Total fluoroscopy time 36 seconds.
IMPRESSION: Post procedural fluoroscopic spot images PLIF L4-L5 and L5-S1.

## 2018-09-06 IMAGING — CR DG LUMBAR SPINE 1V
1 series · 1 of 1 positions shown · non-contrast
Comparison: 02/15/2017 postmyelogram CT.

CLINICAL DATA: 30-year-old male for L4-5 and L5-S1 fusion.
Subsequent encounter.

EXAM:
LUMBAR SPINE - 1 VIEW

[xtable lateral]
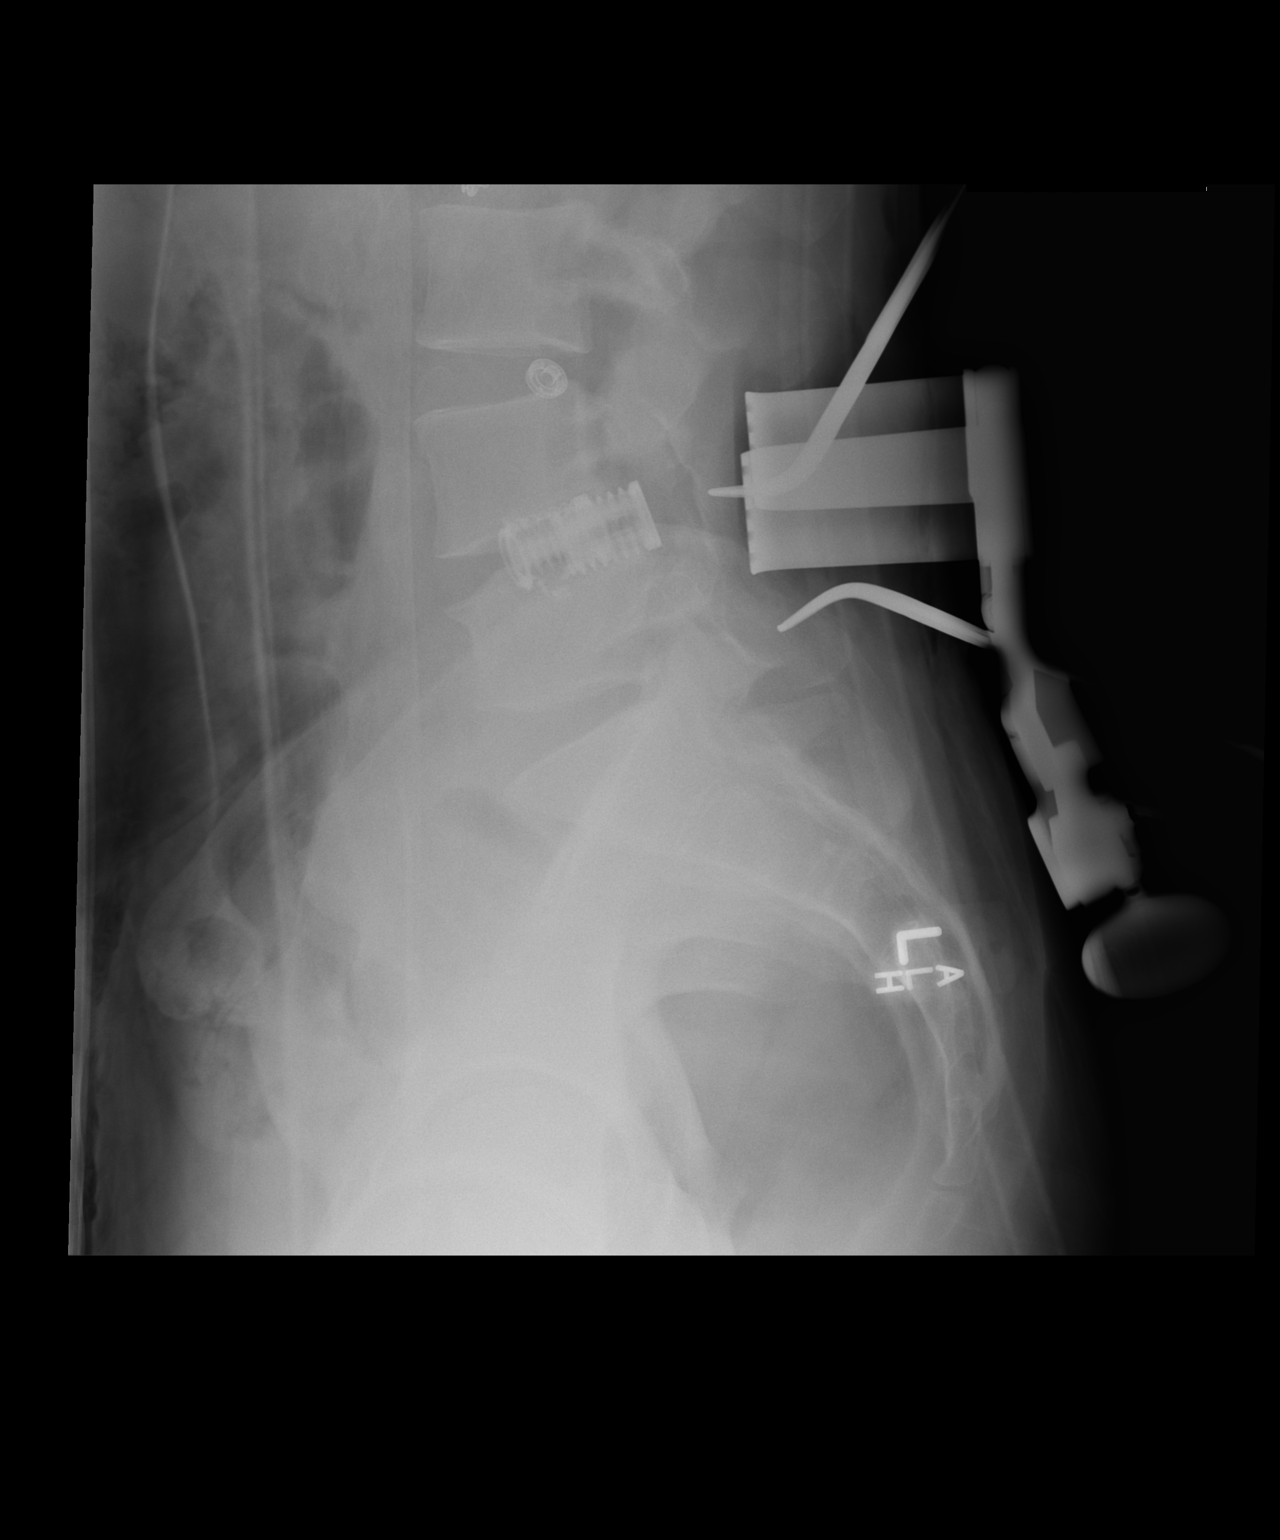

[1 of 1 positions shown; findings below may reference images not displayed]

FINDINGS: Single intraoperative lateral view of the lumbar spine submitted for
review after surgery. This reveals superior metallic probe posterior
to the L4-5 disc space with inferior metallic probe L5 spinous
process level. Metallic rakes posterior to the L4-5 disc space.

Posteriorly located Ray cages L4-5 level.
IMPRESSION: Localization L4-5 and L5-S1 as noted above.

## 2024-11-04 ENCOUNTER — Telehealth: Payer: Self-pay | Admitting: General Practice

## 2024-11-04 NOTE — Telephone Encounter (Signed)
 Copied from CRM #[id number]. Topic: Appointments - Scheduling Inquiry for Clinic >> Nov 04, 2024  4:16 PM Christian Joseph wrote: Reason for CRM: Pt is calling to make new patient appointment with Christian Joseph. He says he is wanting to see him because his parents Christian Joseph and Christian Joseph both see him. He is wondering if Christian Joseph will make an exception to see him as a new patient. Please return his call at [phone]

## 2024-11-14 NOTE — Telephone Encounter (Signed)
 Noted

## 2024-11-19 ENCOUNTER — Ambulatory Visit: Payer: Self-pay | Admitting: Family Medicine

## 2024-11-19 ENCOUNTER — Ambulatory Visit: Admitting: Family Medicine

## 2024-11-19 VITALS — BP 96/60 | HR 58 | Temp 98.4°F | Ht 73.0 in | Wt 247.4 lb

## 2024-11-19 DIAGNOSIS — Z23 Encounter for immunization: Secondary | ICD-10-CM | POA: Diagnosis not present

## 2024-11-19 DIAGNOSIS — Z0000 Encounter for general adult medical examination without abnormal findings: Secondary | ICD-10-CM

## 2024-11-19 LAB — COMPREHENSIVE METABOLIC PANEL WITH GFR
ALT: 26 U/L (ref 3–53)
AST: 21 U/L (ref 5–37)
Albumin: 4.4 g/dL (ref 3.5–5.2)
Alkaline Phosphatase: 76 U/L (ref 39–117)
BUN: 16 mg/dL (ref 6–23)
CO2: 27 meq/L (ref 19–32)
Calcium: 9.6 mg/dL (ref 8.4–10.5)
Chloride: 104 meq/L (ref 96–112)
Creatinine, Ser: 1.34 mg/dL (ref 0.40–1.50)
GFR: 66.82 mL/min
Glucose, Bld: 101 mg/dL — ABNORMAL HIGH (ref 70–99)
Potassium: 4.4 meq/L (ref 3.5–5.1)
Sodium: 139 meq/L (ref 135–145)
Total Bilirubin: 0.6 mg/dL (ref 0.2–1.2)
Total Protein: 7.3 g/dL (ref 6.0–8.3)

## 2024-11-19 LAB — LIPID PANEL
Cholesterol: 177 mg/dL (ref 28–200)
HDL: 52.8 mg/dL
LDL Cholesterol: 98 mg/dL (ref 10–99)
NonHDL: 124.68
Total CHOL/HDL Ratio: 3
Triglycerides: 132 mg/dL (ref 10.0–149.0)
VLDL: 26.4 mg/dL (ref 0.0–40.0)

## 2024-11-19 LAB — CBC WITH DIFFERENTIAL/PLATELET
Basophils Absolute: 0.1 K/uL (ref 0.0–0.1)
Basophils Relative: 0.7 % (ref 0.0–3.0)
Eosinophils Absolute: 0.3 K/uL (ref 0.0–0.7)
Eosinophils Relative: 3.8 % (ref 0.0–5.0)
HCT: 46.2 % (ref 39.0–52.0)
Hemoglobin: 16.1 g/dL (ref 13.0–17.0)
Lymphocytes Relative: 26.6 % (ref 12.0–46.0)
Lymphs Abs: 2.1 K/uL (ref 0.7–4.0)
MCHC: 34.8 g/dL (ref 30.0–36.0)
MCV: 86.5 fl (ref 78.0–100.0)
Monocytes Absolute: 0.5 K/uL (ref 0.1–1.0)
Monocytes Relative: 6.8 % (ref 3.0–12.0)
Neutro Abs: 4.9 K/uL (ref 1.4–7.7)
Neutrophils Relative %: 62.1 % (ref 43.0–77.0)
Platelets: 338 K/uL (ref 150.0–400.0)
RBC: 5.34 Mil/uL (ref 4.22–5.81)
RDW: 12.6 % (ref 11.5–15.5)
WBC: 7.8 K/uL (ref 4.0–10.5)

## 2024-11-19 LAB — TSH: TSH: 1.42 u[IU]/mL (ref 0.35–5.50)

## 2024-11-19 NOTE — Progress Notes (Signed)
 "  New Patient Office Visit  Subjective    Patient ID: Christian Joseph, male    DOB: Jun 27, 1986  Age: 39 y.o. MRN: [id number]  CC:  Chief Complaint  Patient presents with   New Patient (Initial Visit)    HPI Christian Joseph presents to establish care He has not had a primary provider in several years.  No active medical problems.  He does have history of reported lumbar stenosis and has had a couple of back surgeries including 1 in 2003 and he thinks the second 1 was 2018.  At that time he had fusion L4-S1.  No recent back problems.  He also has history of ulcerative colitis.  Had total colectomy back sometime around 2009 and it ended up with rupture of the anastomosis and subsequent surgery around 2018.  Takes no regular medications.  Infrequent use of over-the-counter Advil.  No known drug allergies.  Has had some recent weight gain and just recently started back running on his own.  Health maintenance reviewed:  Health Maintenance  Topic Date Due   Hepatitis C Screening  Never done   DTaP/Tdap/Td (1 - Tdap) Never done   Hepatitis B Vaccines 19-59 Average Risk (1 of 3 - 19+ 3-dose series) Never done   HPV VACCINES (1 - 3-dose SCDM series) Never done   Influenza Vaccine  Never done   COVID-19 Vaccine (1 - 2025-26 season) Never done   HIV Screening  Completed   Pneumococcal Vaccine  Aged Out   Meningococcal B Vaccine  Aged Out   - Social history-married.  He has 83-year-old son.  He has a therapist, music.  He has previously used oral dipping tobacco.  Never smoked.  Drinks usually 1-2 beers per day  Family history-father has history of osteoarthritis and atrial fibrillation but no history of CAD.  Mother and sister alive and well.  No history of diabetes, heart disease, or cancer.  Past Medical History:  Diagnosis Date   Bradycardia    Paresthesia 12/07/2015   Ulcerative colitis    Past Surgical History:  Procedure Laterality Date   BACK SURGERY      COLOPROCTECTOMY W/ ILEO J POUCH      reports that he has never smoked. His smokeless tobacco use includes snuff. He reports current alcohol use. He reports that he does not use drugs. family history is not on file. Allergies[1]   Outpatient Encounter Medications as of 11/19/2024  Medication Sig   ibuprofen (ADVIL,MOTRIN) 200 MG tablet Take 800 mg by mouth every 8 (eight) hours as needed for mild pain or moderate pain.   [DISCONTINUED] HYDROcodone -acetaminophen  (NORCO/VICODIN) 5-325 MG tablet Take 1 tablet by mouth every 6 (six) hours as needed for moderate pain.   [DISCONTINUED] HYDROcodone -acetaminophen  (NORCO/VICODIN) 5-325 MG tablet Take 1-2 tablets by mouth every 4 (four) hours as needed (breakthrough pain).   [DISCONTINUED] methocarbamol  (ROBAXIN ) 500 MG tablet Take 1 tablet (500 mg total) by mouth every 6 (six) hours as needed for muscle spasms.   Facility-Administered Encounter Medications as of 11/19/2024  Medication   gadopentetate dimeglumine  (MAGNEVIST ) injection 19 mL    Past Medical History:  Diagnosis Date   Bradycardia    Paresthesia 12/07/2015   Ulcerative colitis     Past Surgical History:  Procedure Laterality Date   BACK SURGERY     COLOPROCTECTOMY W/ ILEO J POUCH      No family history on file.  Social History   Socioeconomic History   Marital status: Married  Spouse name: Not on file   Number of children: 0   Years of education: HS   Highest education level: Some college, no degree  Occupational History   Occupation: [employer]  Tobacco Use   Smoking status: Never   Smokeless tobacco: Current    Types: Snuff  Substance and Sexual Activity   Alcohol use: Yes    Comment: on weekends   Drug use: No   Sexual activity: Not on file  Other Topics Concern   Not on file  Social History Narrative   Patient drinks about 5-6 cups of caffeine daily.   Patient is right handed.    Social Drivers of Health   Tobacco Use: High Risk (11/19/2024)    Patient History    Smoking Tobacco Use: Never    Smokeless Tobacco Use: Current    Passive Exposure: Not on file  Financial Resource Strain: Low Risk (11/19/2024)   Overall Financial Resource Strain (CARDIA)    Difficulty of Paying Living Expenses: Not hard at all  Food Insecurity: No Food Insecurity (11/19/2024)   Epic    Worried About Programme Researcher, Broadcasting/film/video in the Last Year: Never true    Ran Out of Food in the Last Year: Never true  Transportation Needs: No Transportation Needs (11/19/2024)   Epic    Lack of Transportation (Medical): No    Lack of Transportation (Non-Medical): No  Physical Activity: Unknown (11/19/2024)   Exercise Vital Sign    Days of Exercise per Week: 5 days    Minutes of Exercise per Session: Patient declined  Stress: No Stress Concern Present (11/19/2024)   [employer] of Occupational Health - Occupational Stress Questionnaire    Feeling of Stress: Not at all  Social Connections: Socially Integrated (11/19/2024)   Social Connection and Isolation Panel    Frequency of Communication with Friends and Family: More than three times a week    Frequency of Social Gatherings with Friends and Family: More than three times a week    Attends Religious Services: More than 4 times per year    Active Member of [employer] or Organizations: Yes    Attends Banker Meetings: More than 4 times per year    Marital Status: Married  Catering Manager Violence: Not on file  Depression (PHQ2-9): Low Risk (11/19/2024)   Depression (PHQ2-9)    PHQ-2 Score: 0  Alcohol Screen: Not on file  Housing: Low Risk (11/19/2024)   Epic    Unable to Pay for Housing in the Last Year: No    Number of Times Moved in the Last Year: 0    Homeless in the Last Year: No  Utilities: Not on file  Health Literacy: Not on file    Review of Systems  Constitutional:  Negative for chills, fever, malaise/fatigue and weight loss.  HENT:  Negative for hearing loss.   Eyes:  Negative for blurred  vision and double vision.  Respiratory:  Negative for cough and shortness of breath.   Cardiovascular:  Negative for chest pain, palpitations and leg swelling.  Gastrointestinal:  Negative for abdominal pain, blood in stool, constipation and diarrhea.  Genitourinary:  Negative for dysuria.  Skin:  Negative for rash.  Neurological:  Negative for dizziness, speech change, seizures, loss of consciousness and headaches.  Psychiatric/Behavioral:  Negative for depression.         Objective    BP 96/60   Pulse (!) 58   Temp 98.4 F (36.9 C) (Oral)   Ht  6' 1 (1.854 m)   Wt 247 lb 6.4 oz (112.2 kg)   SpO2 95%   BMI 32.64 kg/m   Physical Exam Vitals reviewed.  Constitutional:      General: He is not in acute distress.    Appearance: He is well-developed. He is not ill-appearing.  HENT:     Head: Normocephalic and atraumatic.     Right Ear: External ear normal.     Left Ear: External ear normal.  Eyes:     Conjunctiva/sclera: Conjunctivae normal.     Pupils: Pupils are equal, round, and reactive to light.  Neck:     Thyroid: No thyromegaly.  Cardiovascular:     Rate and Rhythm: Normal rate and regular rhythm.     Heart sounds: Normal heart sounds. No murmur heard. Pulmonary:     Effort: No respiratory distress.     Breath sounds: No wheezing or rales.  Abdominal:     General: Bowel sounds are normal. There is no distension.     Palpations: Abdomen is soft. There is no mass.     Tenderness: There is no abdominal tenderness. There is no guarding or rebound.     Comments: He has fairly extensive scarring abdomen from prior surgeries.  Abdomen soft and nontender without mass.  Musculoskeletal:     Cervical back: Normal range of motion and neck supple.     Comments: Vertical scar lumbar region from prior surgeries.  Lymphadenopathy:     Cervical: No cervical adenopathy.  Skin:    Findings: No rash.  Neurological:     Mental Status: He is alert and oriented to person, place,  and time.     Cranial Nerves: No cranial nerve deficit.         Assessment & Plan:   Problem List Items Addressed This Visit   None Visit Diagnoses       Physical exam    -  Primary   Relevant Orders   CBC with Differential/Platelet   Lipid panel   CMP   TSH     39 year old male here to establish care and for well visit today.  Past medical history as above.  We discussed the following health maintenance items  -He just received flu vaccine a few weeks ago - Overdue for tetanus.  Tdap given today - Obtain some initial screening labs as above - Discussed importance of regular exercise habits and importance of weight control.  He has recently gained about 20 pounds and hopes to lose this back through running and overall calorie restriction  No follow-ups on file.   Wolm Scarlet, MD      [1] No Known Allergies  "

## 2024-11-19 NOTE — Addendum Note (Signed)
 Addended by: METTA KRISTEN CROME on: 11/19/2024 08:42 AM   Modules accepted: Orders
